# Patient Record
Sex: Female | Born: 1980 | Race: White | Hispanic: No | Smoking: Never smoker
Health system: Southern US, Community
[De-identification: ages and names within clinical notes are randomized; demographics above are authoritative.]

## PROBLEM LIST (undated history)

## (undated) DIAGNOSIS — R102 Pelvic and perineal pain: Secondary | ICD-10-CM

## (undated) DIAGNOSIS — K5900 Constipation, unspecified: Secondary | ICD-10-CM

## (undated) DIAGNOSIS — N801 Endometriosis of ovary: Secondary | ICD-10-CM

## (undated) DIAGNOSIS — Z973 Presence of spectacles and contact lenses: Secondary | ICD-10-CM

## (undated) DIAGNOSIS — N83299 Other ovarian cyst, unspecified side: Secondary | ICD-10-CM

## (undated) DIAGNOSIS — R011 Cardiac murmur, unspecified: Secondary | ICD-10-CM

## (undated) HISTORY — PX: DILATION AND CURETTAGE OF UTERUS: SHX78

---

## 2005-03-14 ENCOUNTER — Emergency Department (HOSPITAL_COMMUNITY): Admission: EM | Admit: 2005-03-14 | Discharge: 2005-03-14 | Payer: Self-pay | Admitting: Family Medicine

## 2005-03-27 ENCOUNTER — Emergency Department (HOSPITAL_COMMUNITY): Admission: EM | Admit: 2005-03-27 | Discharge: 2005-03-27 | Payer: Self-pay | Admitting: Family Medicine

## 2005-04-28 ENCOUNTER — Emergency Department (HOSPITAL_COMMUNITY): Admission: EM | Admit: 2005-04-28 | Discharge: 2005-04-28 | Payer: Self-pay | Admitting: Emergency Medicine

## 2005-10-07 ENCOUNTER — Emergency Department (HOSPITAL_COMMUNITY): Admission: EM | Admit: 2005-10-07 | Discharge: 2005-10-07 | Payer: Self-pay | Admitting: Family Medicine

## 2007-01-22 ENCOUNTER — Emergency Department (HOSPITAL_COMMUNITY): Admission: EM | Admit: 2007-01-22 | Discharge: 2007-01-22 | Payer: Self-pay | Admitting: Emergency Medicine

## 2007-09-27 ENCOUNTER — Emergency Department (HOSPITAL_COMMUNITY): Admission: EM | Admit: 2007-09-27 | Discharge: 2007-09-27 | Payer: Self-pay | Admitting: Family Medicine

## 2008-04-15 ENCOUNTER — Emergency Department (HOSPITAL_COMMUNITY): Admission: EM | Admit: 2008-04-15 | Discharge: 2008-04-15 | Payer: Self-pay | Admitting: Emergency Medicine

## 2008-06-24 ENCOUNTER — Emergency Department (HOSPITAL_COMMUNITY): Admission: EM | Admit: 2008-06-24 | Discharge: 2008-06-24 | Payer: Self-pay | Admitting: Family Medicine

## 2008-08-11 ENCOUNTER — Emergency Department (HOSPITAL_COMMUNITY): Admission: EM | Admit: 2008-08-11 | Discharge: 2008-08-11 | Payer: Self-pay | Admitting: Emergency Medicine

## 2008-09-05 ENCOUNTER — Emergency Department (HOSPITAL_COMMUNITY): Admission: EM | Admit: 2008-09-05 | Discharge: 2008-09-05 | Payer: Self-pay | Admitting: Emergency Medicine

## 2009-01-08 ENCOUNTER — Emergency Department (HOSPITAL_COMMUNITY): Admission: EM | Admit: 2009-01-08 | Discharge: 2009-01-08 | Payer: Self-pay | Admitting: Emergency Medicine

## 2009-01-08 ENCOUNTER — Ambulatory Visit: Payer: Self-pay | Admitting: Obstetrics and Gynecology

## 2009-01-08 ENCOUNTER — Inpatient Hospital Stay (HOSPITAL_COMMUNITY): Admission: AD | Admit: 2009-01-08 | Discharge: 2009-01-08 | Payer: Self-pay | Admitting: Obstetrics and Gynecology

## 2009-09-02 ENCOUNTER — Inpatient Hospital Stay (HOSPITAL_COMMUNITY): Admission: AD | Admit: 2009-09-02 | Discharge: 2009-09-03 | Payer: Self-pay | Admitting: Obstetrics & Gynecology

## 2009-09-03 ENCOUNTER — Inpatient Hospital Stay (HOSPITAL_COMMUNITY): Admission: AD | Admit: 2009-09-03 | Discharge: 2009-09-05 | Payer: Self-pay | Admitting: Obstetrics and Gynecology

## 2010-04-23 LAB — CBC
MCH: 33.9 pg (ref 26.0–34.0)
MCH: 34.5 pg — ABNORMAL HIGH (ref 26.0–34.0)
MCV: 99 fL (ref 78.0–100.0)
Platelets: 156 10*3/uL (ref 150–400)
Platelets: 186 10*3/uL (ref 150–400)
Platelets: 189 10*3/uL (ref 150–400)
RBC: 3.16 MIL/uL — ABNORMAL LOW (ref 3.87–5.11)
RBC: 3.57 MIL/uL — ABNORMAL LOW (ref 3.87–5.11)
RDW: 12.8 % (ref 11.5–15.5)
RDW: 13.1 % (ref 11.5–15.5)
WBC: 15.8 10*3/uL — ABNORMAL HIGH (ref 4.0–10.5)
WBC: 16.3 10*3/uL — ABNORMAL HIGH (ref 4.0–10.5)

## 2010-04-23 LAB — COMPREHENSIVE METABOLIC PANEL
Albumin: 2.8 g/dL — ABNORMAL LOW (ref 3.5–5.2)
BUN: 7 mg/dL (ref 6–23)
CO2: 21 mEq/L (ref 19–32)
Chloride: 108 mEq/L (ref 96–112)
Creatinine, Ser: 0.65 mg/dL (ref 0.4–1.2)
GFR calc non Af Amer: >60 mL/min (ref >60)
Total Bilirubin: 0.8 mg/dL (ref 0.3–1.2)

## 2010-04-23 LAB — URINALYSIS, ROUTINE W REFLEX MICROSCOPIC
Nitrite: NEGATIVE
Protein, ur: NEGATIVE mg/dL
Urobilinogen, UA: 0.2 mg/dL (ref 0.0–1.0)

## 2010-04-23 LAB — URIC ACID: Uric Acid, Serum: 5.5 mg/dL (ref 2.4–7.0)

## 2010-05-10 LAB — WET PREP, GENITAL
Trich, Wet Prep: NONE SEEN
Yeast Wet Prep HPF POC: NONE SEEN

## 2010-05-10 LAB — GC/CHLAMYDIA PROBE AMP, GENITAL: GC Probe Amp, Genital: NEGATIVE

## 2010-05-10 LAB — ABO/RH: ABO/RH(D): A POS

## 2010-05-15 LAB — URINE CULTURE

## 2010-05-15 LAB — POCT URINALYSIS DIP (DEVICE)
Bilirubin Urine: NEGATIVE
Glucose, UA: 500 mg/dL — AB
Ketones, ur: NEGATIVE mg/dL
pH: 5 (ref 5.0–8.0)

## 2010-05-15 LAB — GLUCOSE, CAPILLARY: Glucose-Capillary: 164 mg/dL — ABNORMAL HIGH (ref 70–99)

## 2010-05-17 LAB — GC/CHLAMYDIA PROBE AMP, GENITAL
Chlamydia, DNA Probe: NEGATIVE
GC Probe Amp, Genital: NEGATIVE

## 2010-05-17 LAB — WET PREP, GENITAL
Trich, Wet Prep: NONE SEEN
Yeast Wet Prep HPF POC: NONE SEEN

## 2010-11-11 LAB — URINE CULTURE

## 2010-11-11 LAB — POCT URINALYSIS DIP (DEVICE)
Ketones, ur: NEGATIVE
Operator id: 235561
Protein, ur: NEGATIVE
Specific Gravity, Urine: 1.01
Urobilinogen, UA: 0.2
pH: 5

## 2012-11-20 LAB — OB RESULTS CONSOLE HEPATITIS B SURFACE ANTIGEN: Hepatitis B Surface Ag: NEGATIVE

## 2012-11-20 LAB — OB RESULTS CONSOLE ABO/RH: RH Type: POSITIVE

## 2012-11-20 LAB — OB RESULTS CONSOLE GC/CHLAMYDIA
Chlamydia: NEGATIVE
GC PROBE AMP, GENITAL: NEGATIVE

## 2012-11-20 LAB — OB RESULTS CONSOLE RPR: RPR: NONREACTIVE

## 2012-11-20 LAB — OB RESULTS CONSOLE RUBELLA ANTIBODY, IGM: RUBELLA: IMMUNE

## 2012-11-20 LAB — OB RESULTS CONSOLE HIV ANTIBODY (ROUTINE TESTING): HIV: NONREACTIVE

## 2012-11-20 LAB — OB RESULTS CONSOLE ANTIBODY SCREEN: ANTIBODY SCREEN: NEGATIVE

## 2013-02-06 NOTE — L&D Delivery Note (Signed)
Delivery Note At 9:35 AM a viable and healthy female was delivered via Vaginal, Spontaneous Delivery (Presentation: Left Occiput Anterior).  APGAR: 9 and 9; weight pending Placenta status: Intact, Spontaneous.  Cord: 3 vessels with the following complications: .  Cord pH: N/A  Anesthesia: Epidural  Episiotomy: none Lacerations: 2nd degree Suture Repair: 3.0 vicryl rapide Est. Blood Loss (mL): 400cc  Mom to postpartum.  Baby to Couplet care / Skin to Skin.  Robley FriesVaishali R Zaylia Riolo 06/21/2013, 10:03 AM

## 2013-06-09 LAB — OB RESULTS CONSOLE GBS: GBS: NEGATIVE

## 2013-06-10 ENCOUNTER — Other Ambulatory Visit: Payer: Self-pay | Admitting: Obstetrics & Gynecology

## 2013-06-16 ENCOUNTER — Telehealth (HOSPITAL_COMMUNITY): Payer: Self-pay | Admitting: *Deleted

## 2013-06-16 ENCOUNTER — Encounter (HOSPITAL_COMMUNITY): Payer: Self-pay | Admitting: *Deleted

## 2013-06-16 NOTE — Telephone Encounter (Signed)
Preadmission screen  

## 2013-06-20 ENCOUNTER — Inpatient Hospital Stay (HOSPITAL_COMMUNITY)
Admission: RE | Admit: 2013-06-20 | Discharge: 2013-06-22 | DRG: 775 | Disposition: A | Discharge status: Home / Self Care | Payer: PRIVATE HEALTH INSURANCE | Source: Ambulatory Visit | Attending: Obstetrics & Gynecology | Admitting: Obstetrics & Gynecology

## 2013-06-20 ENCOUNTER — Encounter (HOSPITAL_COMMUNITY): Payer: Self-pay

## 2013-06-20 DIAGNOSIS — K219 Gastro-esophageal reflux disease without esophagitis: Secondary | ICD-10-CM | POA: Diagnosis present

## 2013-06-20 DIAGNOSIS — Z8249 Family history of ischemic heart disease and other diseases of the circulatory system: Secondary | ICD-10-CM

## 2013-06-20 DIAGNOSIS — IMO0001 Reserved for inherently not codable concepts without codable children: Secondary | ICD-10-CM

## 2013-06-20 DIAGNOSIS — Z833 Family history of diabetes mellitus: Secondary | ICD-10-CM

## 2013-06-20 LAB — CBC
HEMATOCRIT: 38.3 % (ref 36.0–46.0)
Hemoglobin: 13 g/dL (ref 12.0–15.0)
MCH: 32.2 pg (ref 26.0–34.0)
MCHC: 33.9 g/dL (ref 30.0–36.0)
MCV: 94.8 fL (ref 78.0–100.0)
PLATELETS: 220 10*3/uL (ref 150–400)
RBC: 4.04 MIL/uL (ref 3.87–5.11)
RDW: 13.8 % (ref 11.5–15.5)
WBC: 9.4 10*3/uL (ref 4.0–10.5)

## 2013-06-20 LAB — RPR

## 2013-06-20 MED ORDER — TERBUTALINE SULFATE 1 MG/ML IJ SOLN: 0.2500 mg | Freq: Once | INTRAMUSCULAR | Status: AC | PRN

## 2013-06-20 MED ORDER — LIDOCAINE HCL (PF) 1 % IJ SOLN
30.0000 mL | INTRAMUSCULAR | Status: DC | PRN
Start: 2013-06-20 — End: 2013-06-21
  Filled 2013-06-20: qty 30

## 2013-06-20 MED ORDER — IBUPROFEN 600 MG PO TABS: 600.0000 mg | ORAL_TABLET | Freq: Four times a day (QID) | ORAL | Status: DC | PRN

## 2013-06-20 MED ORDER — ACETAMINOPHEN 325 MG PO TABS: 650.0000 mg | ORAL_TABLET | ORAL | Status: DC | PRN

## 2013-06-20 MED ORDER — CITRIC ACID-SODIUM CITRATE 334-500 MG/5ML PO SOLN: 30.0000 mL | ORAL | Status: DC | PRN

## 2013-06-20 MED ORDER — LACTATED RINGERS IV SOLN
INTRAVENOUS | Status: DC
  Administered 2013-06-20: 14:00:00 via INTRAVENOUS

## 2013-06-20 MED ORDER — OXYTOCIN 40 UNITS IN LACTATED RINGERS INFUSION - SIMPLE MED
1.0000 m[IU]/min | INTRAVENOUS | Status: DC
  Administered 2013-06-20: 2 m[IU]/min via INTRAVENOUS
  Filled 2013-06-20: qty 1000

## 2013-06-20 MED ORDER — LACTATED RINGERS IV SOLN: 500.0000 mL | INTRAVENOUS | Status: DC | PRN

## 2013-06-20 MED ORDER — BUTORPHANOL TARTRATE 1 MG/ML IJ SOLN
1.0000 mg | INTRAMUSCULAR | Status: DC | PRN
  Administered 2013-06-21: 1 mg via INTRAVENOUS
  Filled 2013-06-20: qty 1

## 2013-06-20 MED ORDER — ONDANSETRON HCL 4 MG/2ML IJ SOLN: 4.0000 mg | Freq: Four times a day (QID) | INTRAMUSCULAR | Status: DC | PRN

## 2013-06-20 MED ORDER — OXYTOCIN BOLUS FROM INFUSION
500.0000 mL | INTRAVENOUS | Status: DC
  Administered 2013-06-21: 500 mL via INTRAVENOUS

## 2013-06-20 MED ORDER — OXYTOCIN 40 UNITS IN LACTATED RINGERS INFUSION - SIMPLE MED: 62.5000 mL/h | INTRAVENOUS | Status: DC

## 2013-06-20 MED ORDER — OXYCODONE-ACETAMINOPHEN 5-325 MG PO TABS: 1.0000 | ORAL_TABLET | ORAL | Status: DC | PRN

## 2013-06-20 NOTE — H&P (Signed)
Regina Scott is a 33 y.o. female presenting for labor IOL due to childcare needs at 39.1 wks. No UCs/ bleeding/ leaking.  PNCare with [facility]// primary Dr Juliene PinaMody. 1st trimester Prometrium and bbASA use for prior SAB and low progesterone in 1st trimester. Right hip pain with numbness in pregnancy. 45 lb wt gain with normal 3hr GTT.   History OB History   Grav Para Term Preterm Abortions TAB SAB Ect Mult Living   4 1 1  2 1 1   1      Past Medical History  Diagnosis Date  . GERD (gastroesophageal reflux disease)   . Other elevated white blood cell count   . Medical history non-contributory    Past Surgical History  Procedure Laterality Date  . Dilation and curettage of uterus     Family History: family history includes COPD in her father; Cancer in her mother; Diabetes in her maternal grandmother; Heart attack in her maternal grandmother. Social History:  reports that she has never smoked. She does not have any smokeless tobacco history on file. She reports that she does not drink alcohol or use illicit drugs.   Prenatal Transfer Tool  Maternal Diabetes: No Genetic Screening: Declined, AFP1 negative Maternal Ultrasounds/Referrals: Normal Fetal Ultrasounds or other Referrals:  None Maternal Substance Abuse:  No Significant Maternal Medications:  None Significant Maternal Lab Results:  Lab values include: Group B Strep negative Other Comments:  None  ROS neg   Dilation: 1.5 Effacement (%): 50 Station: -3 Exam by:: dr. Annamay Laymon Blood pressure 130/81, pulse 75, temperature 98.2 F (36.8 C), temperature source Oral, resp. rate 18, height 5\' 8"  (1.727 m), weight 207 lb (93.895 kg), last menstrual period 09/08/2012. Exam Physical Exam   A&O x 3, no acute distress. Pleasant HEENT neg, no thyromegaly Lungs CTA bilat CV RRR, S1S2 normal Abdo soft, non tender, non acute Extr no edema/ tenderness FHT 140s/ + accels/ no decels/ moderate variability- category I Toco  irregular  Prenatal labs: ABO, Rh: A/Positive/-- (10/15 0000) Antibody: Negative (10/15 0000) Rubella: Immune (10/15 0000) RPR: NON REAC (05/15 1340)  HBsAg: Negative (10/15 0000)  HIV: Non-reactive (10/15 0000)  GBS: Negative (05/04 0000)  1hr Gflucola 154, 3hr GTT normal   Assessment/Plan: 33 yo, G4P1021 at 39.1 wks, labor IOL for childcare arrangement of 1st baby.  GBS neg. Pitocin per protocol. FHT- category I. EFW 7.1/2- 8 lbs.   Robley FriesVaishali R Maude Gloor 06/20/2013

## 2013-06-21 ENCOUNTER — Encounter (HOSPITAL_COMMUNITY): Payer: Self-pay

## 2013-06-21 ENCOUNTER — Inpatient Hospital Stay (HOSPITAL_COMMUNITY): Payer: PRIVATE HEALTH INSURANCE | Admitting: Anesthesiology

## 2013-06-21 ENCOUNTER — Encounter (HOSPITAL_COMMUNITY): Payer: PRIVATE HEALTH INSURANCE | Admitting: Anesthesiology

## 2013-06-21 MED ORDER — FENTANYL 2.5 MCG/ML BUPIVACAINE 1/10 % EPIDURAL INFUSION (WH - ANES)
INTRAMUSCULAR | Status: DC | PRN
  Administered 2013-06-21: 14 mL/h via EPIDURAL

## 2013-06-21 MED ORDER — LORATADINE 10 MG PO TABS
10.0000 mg | ORAL_TABLET | Freq: Every day | ORAL | Status: DC
  Administered 2013-06-21 – 2013-06-22 (×2): 10 mg via ORAL
  Filled 2013-06-21 (×3): qty 1

## 2013-06-21 MED ORDER — PHENYLEPHRINE 40 MCG/ML (10ML) SYRINGE FOR IV PUSH (FOR BLOOD PRESSURE SUPPORT)
80.0000 ug | PREFILLED_SYRINGE | INTRAVENOUS | Status: DC | PRN
  Filled 2013-06-21: qty 2

## 2013-06-21 MED ORDER — WITCH HAZEL-GLYCERIN EX PADS: 1.0000 "application " | MEDICATED_PAD | CUTANEOUS | Status: DC | PRN

## 2013-06-21 MED ORDER — TETANUS-DIPHTH-ACELL PERTUSSIS 5-2.5-18.5 LF-MCG/0.5 IM SUSP
0.5000 mL | Freq: Once | INTRAMUSCULAR | Status: DC
Start: 2013-06-22 — End: 2013-06-22

## 2013-06-21 MED ORDER — PANTOPRAZOLE SODIUM 40 MG PO TBEC
40.0000 mg | DELAYED_RELEASE_TABLET | Freq: Every day | ORAL | Status: DC
  Administered 2013-06-22: 40 mg via ORAL
  Filled 2013-06-21 (×3): qty 1

## 2013-06-21 MED ORDER — OXYCODONE-ACETAMINOPHEN 5-325 MG PO TABS
1.0000 | ORAL_TABLET | ORAL | Status: DC | PRN
Start: 2013-06-21 — End: 2013-06-22
  Administered 2013-06-21 (×2): 1 via ORAL
  Filled 2013-06-21 (×2): qty 1

## 2013-06-21 MED ORDER — ONDANSETRON HCL 4 MG PO TABS: 4.0000 mg | ORAL_TABLET | ORAL | Status: DC | PRN

## 2013-06-21 MED ORDER — ONDANSETRON HCL 4 MG/2ML IJ SOLN: 4.0000 mg | INTRAMUSCULAR | Status: DC | PRN

## 2013-06-21 MED ORDER — FENTANYL 2.5 MCG/ML BUPIVACAINE 1/10 % EPIDURAL INFUSION (WH - ANES)
14.0000 mL/h | INTRAMUSCULAR | Status: DC | PRN
  Filled 2013-06-21: qty 125

## 2013-06-21 MED ORDER — SENNOSIDES-DOCUSATE SODIUM 8.6-50 MG PO TABS
2.0000 | ORAL_TABLET | ORAL | Status: DC
  Administered 2013-06-21: 2 via ORAL
  Filled 2013-06-21: qty 2

## 2013-06-21 MED ORDER — ZOLPIDEM TARTRATE 5 MG PO TABS: 5.0000 mg | ORAL_TABLET | Freq: Every evening | ORAL | Status: DC | PRN

## 2013-06-21 MED ORDER — BENZOCAINE-MENTHOL 20-0.5 % EX AERO
1.0000 "application " | INHALATION_SPRAY | CUTANEOUS | Status: DC | PRN
  Administered 2013-06-21: 1 via TOPICAL
  Filled 2013-06-21: qty 56

## 2013-06-21 MED ORDER — DIPHENHYDRAMINE HCL 50 MG/ML IJ SOLN: 12.5000 mg | INTRAMUSCULAR | Status: DC | PRN

## 2013-06-21 MED ORDER — EPHEDRINE 5 MG/ML INJ
10.0000 mg | INTRAVENOUS | Status: DC | PRN
  Filled 2013-06-21: qty 4
  Filled 2013-06-21: qty 2

## 2013-06-21 MED ORDER — EPHEDRINE 5 MG/ML INJ
10.0000 mg | INTRAVENOUS | Status: DC | PRN
  Filled 2013-06-21: qty 2

## 2013-06-21 MED ORDER — IBUPROFEN 600 MG PO TABS
600.0000 mg | ORAL_TABLET | Freq: Four times a day (QID) | ORAL | Status: DC
  Administered 2013-06-21 – 2013-06-22 (×5): 600 mg via ORAL
  Filled 2013-06-21 (×5): qty 1

## 2013-06-21 MED ORDER — LACTATED RINGERS IV SOLN: 500.0000 mL | Freq: Once | INTRAVENOUS | Status: DC

## 2013-06-21 MED ORDER — LANOLIN HYDROUS EX OINT: TOPICAL_OINTMENT | CUTANEOUS | Status: DC | PRN

## 2013-06-21 MED ORDER — DIPHENHYDRAMINE HCL 25 MG PO CAPS: 25.0000 mg | ORAL_CAPSULE | Freq: Four times a day (QID) | ORAL | Status: DC | PRN

## 2013-06-21 MED ORDER — DIBUCAINE 1 % RE OINT: 1.0000 "application " | TOPICAL_OINTMENT | RECTAL | Status: DC | PRN

## 2013-06-21 MED ORDER — SIMETHICONE 80 MG PO CHEW: 80.0000 mg | CHEWABLE_TABLET | ORAL | Status: DC | PRN

## 2013-06-21 MED ORDER — PHENYLEPHRINE 40 MCG/ML (10ML) SYRINGE FOR IV PUSH (FOR BLOOD PRESSURE SUPPORT)
80.0000 ug | PREFILLED_SYRINGE | INTRAVENOUS | Status: DC | PRN
  Filled 2013-06-21: qty 2
  Filled 2013-06-21: qty 10

## 2013-06-21 MED ORDER — LIDOCAINE HCL (PF) 1 % IJ SOLN
INTRAMUSCULAR | Status: DC | PRN
  Administered 2013-06-21 (×2): 4 mL

## 2013-06-21 MED ORDER — PRENATAL MULTIVITAMIN CH
1.0000 | ORAL_TABLET | Freq: Every day | ORAL | Status: DC
  Administered 2013-06-21 – 2013-06-22 (×2): 1 via ORAL
  Filled 2013-06-21 (×2): qty 1

## 2013-06-21 NOTE — Lactation Note (Signed)
This note was copied from the chart of [name]. Lactation Consultation Note Follow [facility] at 14 hours of age[facility] pain/trauma.  Comfort gels given by Rn.  Mom has normal erect nipples with bruising/blistering and now misshaped nipple on right breast.  Middle area is whitish with little colostrum expressed and rubbed into nipples.  Comfort gels not in use and I assisted with putting them on her.  She was wearing sore nipple shells with visible edema to areola.  Encouraged to not use shells and to keep comfort gels on through out the night.  Mom has a hand pump to use on left breast and allow baby to feed on demand on right.   Baby sucks well and coordinated on gloved finger.  Tongue is heart shaped on the tip with a questionable tight frenulum, but does not seem to be an issue with baby's suck.  [facility]    Patient Name: [name] WUJWJ'XToday's Date: 06/21/2013 Reason for consult: Follow-up assessment;Difficult latch;Breast/nipple pain   Maternal Data Has patient been taught Hand Expression?: Yes  Feeding Feeding Type: Breast Fed Length of feed: 5 min  LATCH Score/Interventions Latch: Repeated attempts needed to sustain latch, nipple held in mouth throughout feeding, stimulation needed to elicit sucking reflex. Intervention(s): Skin to skin Intervention(s): Adjust position;Assist with latch;Breast massage;Breast compression  Audible Swallowing: A few with stimulation Intervention(s): Skin to skin  Type of Nipple: Everted at rest and after stimulation  Comfort (Breast/Nipple): Engorged, cracked, bleeding, large blisters, severe discomfort Problem noted: Cracked, bleeding, blisters, bruises Intervention(s): Other (comment) (comfort gels)     Hold (Positioning): No assistance needed to correctly position infant at breast. Intervention(s): Breastfeeding basics reviewed  LATCH Score: 6  Lactation Tools Discussed/Used Tools:  Comfort gels   Consult Status Consult Status: Follow-up Date: 06/22/13 Follow-up type: In-patient    [name] 06/21/2013, 11:50 PM

## 2013-06-21 NOTE — Anesthesia Procedure Notes (Signed)
Epidural Patient location during procedure: OB Start time: 06/21/2013 6:22 AM  Staffing Anesthesiologist: Haedyn Ancrum A. Performed by: anesthesiologist   Preanesthetic Checklist Completed: patient identified, site marked, surgical consent, pre-op evaluation, timeout performed, IV checked, risks and benefits discussed and monitors and equipment checked  Epidural Patient position: sitting Prep: site prepped and draped and DuraPrep Patient monitoring: continuous pulse ox and blood pressure Approach: midline Location: L3-L4 Injection technique: LOR air  Needle:  Needle type: Tuohy  Needle gauge: 17 G Needle length: 9 cm and 9 Needle insertion depth: 5 cm cm Catheter type: closed end flexible Catheter size: 19 Gauge Catheter at skin depth: 10 cm Test dose: negative and Other  Assessment Events: blood not aspirated, injection not painful, no injection resistance, negative IV test and no paresthesia  Additional Notes Patient identified. Risks and benefits discussed including failed block, incomplete  Pain control, post dural puncture headache, nerve damage, paralysis, blood pressure Changes, nausea, vomiting, reactions to medications-both toxic and allergic and post Partum back pain. All questions were answered. Patient expressed understanding and wished to proceed. Sterile technique was used throughout procedure. Epidural site was Dressed with sterile barrier dressing. No paresthesias, signs of intravascular injection Or signs of intrathecal spread were encountered.  Patient was more comfortable after the epidural was dosed. Please see RN's note for documentation of vital signs and FHR which are stable.

## 2013-06-21 NOTE — Anesthesia Preprocedure Evaluation (Signed)
Anesthesia Evaluation  Patient identified by MRN, date of birth, ID band Patient awake    Reviewed: Allergy & Precautions, H&P , Patient's Chart, lab work & pertinent test results  Airway Mallampati: III TM Distance: >3 FB Neck ROM: Full    Dental no notable dental hx. (+) Teeth Intact   Pulmonary neg pulmonary ROS,  breath sounds clear to auscultation  Pulmonary exam normal       Cardiovascular negative cardio ROS  Rhythm:Regular Rate:Normal     Neuro/Psych negative neurological ROS  negative psych ROS   GI/Hepatic Neg liver ROS, GERD-  Medicated and Controlled,  Endo/Other  Obesity   Renal/GU negative Renal ROS  negative genitourinary   Musculoskeletal negative musculoskeletal ROS (+)   Abdominal (+) + obese,   Peds  Hematology negative hematology ROS (+)   Anesthesia Other Findings   Reproductive/Obstetrics (+) Pregnancy                           Anesthesia Physical Anesthesia Plan  ASA: II  Anesthesia Plan: Epidural   Post-op Pain Management:    Induction:   Airway Management Planned: Natural Airway  Additional Equipment:   Intra-op Plan:   Post-operative Plan:   Informed Consent: I have reviewed the patients History and Physical, chart, labs and discussed the procedure including the risks, benefits and alternatives for the proposed anesthesia with the patient or authorized representative who has indicated his/her understanding and acceptance.     Plan Discussed with: Anesthesiologist  Anesthesia Plan Comments:         Anesthesia Quick Evaluation

## 2013-06-22 LAB — CBC
HCT: 36.5 % (ref 36.0–46.0)
Hemoglobin: 12.1 g/dL (ref 12.0–15.0)
MCH: 31.6 pg (ref 26.0–34.0)
MCHC: 33.2 g/dL (ref 30.0–36.0)
MCV: 95.3 fL (ref 78.0–100.0)
PLATELETS: 220 10*3/uL (ref 150–400)
RBC: 3.83 MIL/uL — ABNORMAL LOW (ref 3.87–5.11)
RDW: 14 % (ref 11.5–15.5)
WBC: 11.6 10*3/uL — ABNORMAL HIGH (ref 4.0–10.5)

## 2013-06-22 MED ORDER — IBUPROFEN 600 MG PO TABS: 600.0000 mg | ORAL_TABLET | Freq: Four times a day (QID) | ORAL | Status: DC

## 2013-06-22 MED ORDER — OXYCODONE-ACETAMINOPHEN 5-325 MG PO TABS: 1.0000 | ORAL_TABLET | ORAL | Status: DC | PRN

## 2013-06-22 NOTE — Lactation Note (Signed)
This note was copied from the chart of Regina Scott. Lactation Consultation Note Left nipple cracked and mom pumping and wearing comfort gels in between.  Assisted with positioning baby on right breast using football hold.  Baby latched easily with good breast compression.  Mom feels initial latch on pain which improves when bottom lip pulled out.  Reviewed basics and answered questions.  Discharge instructions given including what to expect when milk comes to volume and engorgement treatment. Outpatient lactation services and support information given. Patient Name: Regina Scott ZOXWR'UToday's Date: 06/22/2013 Reason for consult: Initial assessment   Maternal Data    Feeding Feeding Type: Breast Fed  LATCH Score/Interventions Latch: Grasps breast easily, tongue down, lips flanged, rhythmical sucking. Intervention(s): Skin to skin;Teach feeding cues;Waking techniques Intervention(s): Adjust position;Assist with latch;Breast massage;Breast compression  Audible Swallowing: A few with stimulation  Type of Nipple: Everted at rest and after stimulation  Comfort (Breast/Nipple): Filling, red/small blisters or bruises, mild/mod discomfort Problem noted: Cracked, bleeding, blisters, bruises Intervention(s): Hand pump     Hold (Positioning): Assistance needed to correctly position infant at breast and maintain latch. Intervention(s): Breastfeeding basics reviewed;Support Pillows;Position options;Skin to skin  LATCH Score: 7  Lactation Tools Discussed/Used Tools: Comfort gels   Consult Status Consult Status: Follow-up Date: 06/23/13 Follow-up type: In-patient    Hansel FeinsteinLaura Ann Powell 06/22/2013, 12:48 PM

## 2013-06-22 NOTE — Discharge Summary (Signed)
Obstetric Discharge Summary  Reason for Admission: induction of labor Prenatal Procedures: none Intrapartum Procedures: spontaneous vaginal delivery Postpartum Procedures: none Complications-Operative and Postpartum: 2nd degree perineal laceration Hemoglobin  Date Value Ref Range Status  06/22/2013 12.1  12.0 - 15.0 g/dL Final     HCT  Date Value Ref Range Status  06/22/2013 36.5  36.0 - 46.0 % Final    Physical Exam:  General: alert, cooperative and no distress Lochia: appropriate Uterine Fundus: firm Incision: healing well DVT Evaluation: No evidence of DVT seen on physical exam.  Discharge Diagnoses: Term Pregnancy-delivered  Discharge Information: Date: 06/22/2013 Activity: pelvic rest Diet: routine Medications: PNV, Ibuprofen and Percocet Condition: stable Instructions: refer to practice specific booklet Discharge to: home Follow-up Information   Follow up with MODY,VAISHALI R, MD. Schedule an appointment as soon as possible for a visit in 6 weeks.   Specialty:  Obstetrics and Gynecology   Contact information:   [address] Gash[address] [place] [id number] [phone]       Newborn Data: Live born female  Birth Weight: 8 lb (3629 g) APGAR: 9, 9  Home with mother.  Marlinda Mikeanya Bryar Rennie 06/22/2013, 1:37 PM

## 2013-06-22 NOTE — Progress Notes (Signed)
PPD 1 SVD  S:  Reports feeling well - wants early DC             Tolerating po/ No nausea or vomiting             Bleeding is light             Pain controlled with motrin and percocet             Up ad lib / ambulatory / voiding QS  Newborn breast feeding  O:               VS: BP 100/64  Pulse 71  Temp(Src) 98 F (36.7 C) (Oral)  Resp 18  Ht 5\' 8"  (1.727 m)  Wt 93.895 kg (207 lb)  BMI 31.48 kg/m2  SpO2 98%  LMP 09/08/2012  Breastfeeding? Unknown   LABS:              Recent Labs  06/20/13 1340 06/22/13 0601  WBC 9.4 11.6*  HGB 13.0 12.1  PLT 220 220               Blood type: A/Positive/-- (10/15 0000)  Rubella: Immune (10/15 0000)                     I&O: Intake/Output     05/16 0701 - 05/17 0700 05/17 0701 - 05/18 0700   Urine (mL/kg/hr) 500 (0.2)    Blood 400 (0.2)    Total Output 900     Net -900                        Physical Exam:             Alert and oriented X3  Lungs: Clear and unlabored  Heart: regular rate and rhythm / no mumurs  Abdomen: soft, non-tender, non-distended              Fundus: firm, non-tender, U-1  Perineum: moderate edema  Lochia: light  Extremities: trace edema, no calf pain or tenderness    A: PPD # 1   Doing well - stable status  P: Routine post partum orders  DC home   Marlinda Mikeanya Taylee Gunnells CNM, MSN, [facility] 06/22/2013, 1:19 PM

## 2013-06-22 NOTE — Anesthesia Postprocedure Evaluation (Signed)
  Anesthesia Post-op Note  Patient: [name]  Procedure(s) Performed: Lumbar Epidural for L&D  Patient Location: Mother/Baby  Anesthesia Type:Epidural  Level of Consciousness: awake, alert  and oriented  Airway and Oxygen Therapy: Patient Spontanous Breathing  Last Vitals:  Filed Vitals:   06/22/13 0510  BP: 100/64  Pulse: 71  Temp: 36.7 C  Resp: 18    Complications: No apparent anesthesia complications

## 2013-06-25 ENCOUNTER — Inpatient Hospital Stay (HOSPITAL_COMMUNITY)
Admission: AD | Admit: 2013-06-25 | Payer: PRIVATE HEALTH INSURANCE | Source: Ambulatory Visit | Admitting: Obstetrics & Gynecology

## 2013-07-01 NOTE — Discharge Summary (Signed)
Reviewed and agree with note and plan. V.Ozelle Brubacher, MD  

## 2013-12-08 ENCOUNTER — Encounter (HOSPITAL_COMMUNITY): Payer: Self-pay

## 2017-02-06 ENCOUNTER — Inpatient Hospital Stay (HOSPITAL_COMMUNITY): Payer: PRIVATE HEALTH INSURANCE

## 2017-02-06 ENCOUNTER — Other Ambulatory Visit: Payer: Self-pay

## 2017-02-06 ENCOUNTER — Encounter (HOSPITAL_COMMUNITY): Payer: Self-pay

## 2017-02-06 ENCOUNTER — Inpatient Hospital Stay (HOSPITAL_COMMUNITY)
Admission: AD | Admit: 2017-02-06 | Discharge: 2017-02-07 | Disposition: A | Discharge status: Home / Self Care | Payer: PRIVATE HEALTH INSURANCE | Source: Ambulatory Visit | Attending: Obstetrics and Gynecology | Admitting: Obstetrics and Gynecology

## 2017-02-06 DIAGNOSIS — N946 Dysmenorrhea, unspecified: Secondary | ICD-10-CM | POA: Diagnosis not present

## 2017-02-06 DIAGNOSIS — Z880 Allergy status to penicillin: Secondary | ICD-10-CM | POA: Diagnosis not present

## 2017-02-06 DIAGNOSIS — N83292 Other ovarian cyst, left side: Secondary | ICD-10-CM | POA: Insufficient documentation

## 2017-02-06 DIAGNOSIS — N939 Abnormal uterine and vaginal bleeding, unspecified: Secondary | ICD-10-CM | POA: Insufficient documentation

## 2017-02-06 DIAGNOSIS — R102 Pelvic and perineal pain: Secondary | ICD-10-CM | POA: Diagnosis not present

## 2017-02-06 DIAGNOSIS — Z825 Family history of asthma and other chronic lower respiratory diseases: Secondary | ICD-10-CM | POA: Diagnosis not present

## 2017-02-06 DIAGNOSIS — R1030 Lower abdominal pain, unspecified: Secondary | ICD-10-CM | POA: Diagnosis present

## 2017-02-06 DIAGNOSIS — N83202 Unspecified ovarian cyst, left side: Secondary | ICD-10-CM

## 2017-02-06 LAB — URINALYSIS, ROUTINE W REFLEX MICROSCOPIC
Bilirubin Urine: NEGATIVE
Glucose, UA: NEGATIVE mg/dL
Ketones, ur: NEGATIVE mg/dL
Leukocytes, UA: NEGATIVE
Nitrite: NEGATIVE
Protein, ur: NEGATIVE mg/dL
Specific Gravity, Urine: 1.018 (ref 1.005–1.030)
pH: 5 (ref 5.0–8.0)

## 2017-02-06 LAB — DIFFERENTIAL
Basophils Absolute: 0 10*3/uL (ref 0.0–0.1)
Basophils Relative: 0 %
EOS ABS: 0.1 10*3/uL (ref 0.0–0.7)
EOS PCT: 1 %
LYMPHS ABS: 2.8 10*3/uL (ref 0.7–4.0)
LYMPHS PCT: 17 %
Monocytes Absolute: 0.6 10*3/uL (ref 0.1–1.0)
Monocytes Relative: 4 %
NEUTROS PCT: 78 %
Neutro Abs: 12.7 10*3/uL — ABNORMAL HIGH (ref 1.7–7.7)

## 2017-02-06 LAB — CBC
HCT: 38.2 % (ref 36.0–46.0)
Hemoglobin: 12.8 g/dL (ref 12.0–15.0)
MCH: 31.3 pg (ref 26.0–34.0)
MCHC: 33.5 g/dL (ref 30.0–36.0)
MCV: 93.4 fL (ref 78.0–100.0)
Platelets: 300 10*3/uL (ref 150–400)
RBC: 4.09 MIL/uL (ref 3.87–5.11)
RDW: 13 % (ref 11.5–15.5)
WBC: 16.4 10*3/uL — ABNORMAL HIGH (ref 4.0–10.5)

## 2017-02-06 LAB — WET PREP, GENITAL
Clue Cells Wet Prep HPF POC: NONE SEEN
Sperm: NONE SEEN
TRICH WET PREP: NONE SEEN
Yeast Wet Prep HPF POC: NONE SEEN

## 2017-02-06 LAB — POCT PREGNANCY, URINE: PREG TEST UR: NEGATIVE

## 2017-02-06 MED ORDER — TRAMADOL HCL 50 MG PO TABS
50.0000 mg | ORAL_TABLET | Freq: Four times a day (QID) | ORAL | Status: DC | PRN
  Administered 2017-02-06: 50 mg via ORAL
  Filled 2017-02-06: qty 1

## 2017-02-06 NOTE — MAU Provider Note (Signed)
History     CSN: [id number]  Arrival date and time: 02/06/17 2114   First Provider Initiated Contact with Patient 02/06/17 2236      Chief Complaint  Patient presents with  . Abdominal Pain  . Vaginal Bleeding   37 y.o non-pregnant female here with pelvic and abdominal pain. Pain started earlier today. Mostly bilateral lower abdomen but radiates into upper abd. Rates 7/10. Took Motrin 800 mg and used heat pad but didn't help. Concerned about vaginal bleeding. Menses started yesterday. Passing small dime sized clots. Changing pads 3 times a day. Not currently sexually active. Recent STD screen at office negative. She has a left ovarian 3cm hemorrhagic cyst that is being followed by GYN.   Past Medical History:  Diagnosis Date  . GERD (gastroesophageal reflux disease)    during pregnancy in 2015  . Other elevated white blood cell count     Past Surgical History:  Procedure Laterality Date  . DILATION AND CURETTAGE OF UTERUS      Family History  Problem Relation Age of Onset  . Cancer Mother        breast  . COPD Father   . Diabetes Maternal Grandmother   . Heart attack Maternal Grandmother     Social History   Tobacco Use  . Smoking status: Never Smoker  . Smokeless tobacco: Never Used  Substance Use Topics  . Alcohol use: No  . Drug use: No    Allergies:  Allergies  Allergen Reactions  . Penicillins Rash    No medications prior to admission.    Review of Systems  Constitutional: Negative for chills and fever.  HENT: Negative for congestion.   Respiratory: Negative for cough.   Gastrointestinal: Positive for abdominal pain. Negative for constipation, diarrhea, nausea and vomiting.  Genitourinary: Positive for pelvic pain and vaginal bleeding. Negative for dysuria, frequency, hematuria and vaginal discharge.   Physical Exam   Blood pressure 106/71, pulse 75, temperature 97.9 F (36.6 C), temperature source Oral, resp. rate 18, height 5\' 8"  (1.727 m),  weight 161 lb (73 kg), last menstrual period 01/19/2017, SpO2 100 %, unknown if currently breastfeeding.  Physical Exam  Nursing note and vitals reviewed. Constitutional: She is oriented to person, place, and time. She appears well-developed and well-nourished. Distressed: appears comfortable.  HENT:  Head: Normocephalic and atraumatic.  Neck: Normal range of motion.  Cardiovascular: Normal rate.  Respiratory: Effort normal. No respiratory distress.  GI: Soft. She exhibits no distension and no mass. There is tenderness (diffuse all quadrants). There is no rebound and no guarding.  Genitourinary:  Genitourinary Comments: External: no lesions or erythema Vagina: rugated, pink, moist, mod drk bloody discharge, vault cleared with 2 fox swabs Uterus: non enlarged, anteverted, + tender, no CMT Adnexae: no masses, + tenderness left, + tenderness right   Musculoskeletal: Normal range of motion.  Neurological: She is alert and oriented to person, place, and time.  Skin: Skin is warm and dry.  Psychiatric: She has a normal mood and affect.   Results for orders placed or performed during the hospital encounter of 02/06/17 (from the past 24 hour(s))  Pregnancy, urine POC     Status: None   Collection Time: 02/06/17 10:13 PM  Result Value Ref Range   Preg Test, Ur NEGATIVE NEGATIVE  Urinalysis, Routine w reflex microscopic     Status: Abnormal   Collection Time: 02/06/17 10:14 PM  Result Value Ref Range   Color, Urine YELLOW YELLOW   APPearance CLEAR CLEAR  Specific Gravity, Urine 1.018 1.005 - 1.030   pH 5.0 5.0 - 8.0   Glucose, UA NEGATIVE NEGATIVE mg/dL   Hgb urine dipstick LARGE (A) NEGATIVE   Bilirubin Urine NEGATIVE NEGATIVE   Ketones, ur NEGATIVE NEGATIVE mg/dL   Protein, ur NEGATIVE NEGATIVE mg/dL   Nitrite NEGATIVE NEGATIVE   Leukocytes, UA NEGATIVE NEGATIVE   RBC / HPF TOO NUMEROUS TO COUNT 0 - 5 RBC/hpf   WBC, UA 0-5 0 - 5 WBC/hpf   Bacteria, UA RARE (A) NONE SEEN    Squamous Epithelial / LPF 0-5 (A) NONE SEEN   Mucus PRESENT   Wet prep, genital     Status: Abnormal   Collection Time: 02/06/17 10:37 PM  Result Value Ref Range   Yeast Wet Prep HPF POC NONE SEEN NONE SEEN   Trich, Wet Prep NONE SEEN NONE SEEN   Clue Cells Wet Prep HPF POC NONE SEEN NONE SEEN   WBC, Wet Prep HPF POC FEW (A) NONE SEEN   Sperm NONE SEEN   CBC     Status: Abnormal   Collection Time: 02/06/17 10:38 PM  Result Value Ref Range   WBC 16.4 (H) 4.0 - 10.5 K/uL   RBC 4.09 3.87 - 5.11 MIL/uL   Hemoglobin 12.8 12.0 - 15.0 g/dL   HCT 44.0 10.2 - 72.5 %   MCV 93.4 78.0 - 100.0 fL   MCH 31.3 26.0 - 34.0 pg   MCHC 33.5 30.0 - 36.0 g/dL   RDW 36.6 44.0 - 34.7 %   Platelets 300 150 - 400 K/uL  Differential     Status: Abnormal   Collection Time: 02/06/17 10:38 PM  Result Value Ref Range   Neutrophils Relative % 78 %   Neutro Abs 12.7 (H) 1.7 - 7.7 K/uL   Lymphocytes Relative 17 %   Lymphs Abs 2.8 0.7 - 4.0 K/uL   Monocytes Relative 4 %   Monocytes Absolute 0.6 0.1 - 1.0 K/uL   Eosinophils Relative 1 %   Eosinophils Absolute 0.1 0.0 - 0.7 K/uL   Basophils Relative 0 %   Basophils Absolute 0.0 0.0 - 0.1 K/uL   US Pelvic Complete W Transvaginal And Torsion R/o  Result Date: 02/07/2017 CLINICAL DATA:  Initial evaluation for acute pain, vaginal bleeding. History of left ovarian cyst. EXAM: TRANSABDOMINAL AND TRANSVAGINAL ULTRASOUND OF PELVIS DOPPLER ULTRASOUND OF OVARIES TECHNIQUE: Both transabdominal and transvaginal ultrasound examinations of the pelvis were performed. Transabdominal technique was performed for global imaging of the pelvis including uterus, ovaries, adnexal regions, and pelvic cul-de-sac. It was necessary to proceed with endovaginal exam following the transabdominal exam to visualize the uterus and ovaries. Color and duplex Doppler ultrasound was utilized to evaluate blood flow to the ovaries. COMPARISON:  None. FINDINGS: Uterus Measurements: 8.7 x 3.7 x 5.2 cm. No  fibroids or other mass visualized. Endometrium Thickness: 4.2 mm.  No focal abnormality visualized. Right ovary Measurements: 3.3 x 2.4 x 2.4 cm. Normal appearance/no adnexal mass. Left ovary Measurements: 6.5 x 5.0 x 6.0 cm. 5.8 x 4.0 x 5.1 cm complex left ovarian cyst. Lesion demonstrates eccentric echogenic material with probable fluid fluid level. No internal vascularity or discrete nodularity. Mildly increased peripheral vascularity about this lesion. Pulsed Doppler evaluation of both ovaries demonstrates normal low-resistance arterial and venous waveforms. Other findings No abnormal free fluid. IMPRESSION: 1. 5.8 x 4.0 x 5.1 cm complex left ovarian cyst. Imaging features favor a hemorrhagic cyst with internal clot and/or fluid fluid level. Given size, a short  interval follow-up study in 6-12 weeks to ensure resolution is recommended. This recommendation follows the consensus statement: Management of Asymptomatic Ovarian and Other Adnexal Cysts Imaged at US: Society of Radiologists in Ultrasound Consensus Conference Statement. Radiology 2010; [id number]. 2. No other acute abnormality within the pelvis. No evidence for torsion. Electronically Signed   By: Rise MuBenjamin  McClintock M.D.   On: 02/07/2017 00:14   MAU Course  Procedures Ultram Toradol  MDM Labs and US ordered and reviewed. Slightly elevated WBC- no source identified. Presentation, clinical findings, and plan discussed with Dr. Ellyn HackBovard. No evidence of acute abdominal or pelvic process. Cyst slightly larger than previous scan, could be source of pain or pain coming from recent onset menses. Pt resting quietly. Some improvement in pain after Toradol. Recommend Aleve 2 tabs q12 hrs prn. Stable for discharge.   Assessment and Plan   1. Left ovarian cyst   2. Pelvic pain   3. Dysmenorrhea    [facility] Aleve OTC prn  Allergies as of 02/07/2017      Reactions   Penicillins Rash      Medication List     STOP taking these medications   ibuprofen 600 MG tablet Commonly known as:  ADVIL,MOTRIN   oxyCODONE-acetaminophen 5-325 MG tablet Commonly known as:  PERCOCET/ROXICET     TAKE these medications   cetirizine 10 MG tablet Commonly known as:  ZYRTEC Take 10 mg by mouth daily.   pantoprazole 40 MG tablet Commonly known as:  PROTONIX Take 40 mg by mouth daily.   prenatal multivitamin Tabs tablet Take 1 tablet by mouth daily at 12 noon.      Donette LarryMelanie Atari Novick, CNM 02/07/2017, 4:24 AM

## 2017-02-06 NOTE — Progress Notes (Addendum)
Non-pregnant pt here for dime size vaginal bleeding that started yesterday. Pt has a golf size cyst on her left ovaries. MD has been monitoring the cyst. U/S scheduled for Feb.   UPT: negative  2233: Dr. Ellyn HackBovard called to unit. Report status of pt given. No new orders received but will discuss POC with NP.   2240: wet prep done. Ordered for U/S   2246: lab at bs  2306: u/s paged and U/S returned page. Made aware pt needs u/s.   2312: relinquished care over to RN Benji.

## 2017-02-06 NOTE — MAU Note (Signed)
Pt here with c/o heavy vaginal bleeding and abdominal pain. Had a cyst ("golf ball sized in left ovary") when seen in the office in early December. At that point had been bleeding for a couple of weeks. Bleeding stopped for about two weeks then started back yesterday, then the pain today.

## 2017-02-07 DIAGNOSIS — R102 Pelvic and perineal pain: Secondary | ICD-10-CM

## 2017-02-07 DIAGNOSIS — N83202 Unspecified ovarian cyst, left side: Secondary | ICD-10-CM | POA: Diagnosis not present

## 2017-02-07 DIAGNOSIS — N946 Dysmenorrhea, unspecified: Secondary | ICD-10-CM | POA: Diagnosis not present

## 2017-02-07 MED ORDER — KETOROLAC TROMETHAMINE 30 MG/ML IJ SOLN
60.0000 mg | Freq: Once | INTRAMUSCULAR | Status: AC
  Administered 2017-02-07: 60 mg via INTRAMUSCULAR
  Filled 2017-02-07 (×2): qty 2

## 2017-02-07 NOTE — Discharge Instructions (Signed)
Ovarian Cyst An ovarian cyst is a fluid-filled sac that forms on an ovary. The ovaries are small organs that produce eggs in women. Various types of cysts can form on the ovaries. Some may cause symptoms and require treatment. Most ovarian cysts go away on their own, are not cancerous (are benign), and do not cause problems. Common types of ovarian cysts include:  Functional (follicle) cysts. ? Occur during the menstrual cycle, and usually go away with the next menstrual cycle if you do not get pregnant. ? Usually cause no symptoms.  Endometriomas. ? Are cysts that form from the tissue that lines the uterus (endometrium). ? Are sometimes called "chocolate cysts" because they become filled with blood that turns brown. ? Can cause pain in the lower abdomen during intercourse and during your period.  Cystadenoma cysts. ? Develop from cells on the outside surface of the ovary. ? Can get very large and cause lower abdomen pain and pain with intercourse. ? Can cause severe pain if they twist or break open (rupture).  Dermoid cysts. ? Are sometimes found in both ovaries. ? May contain different kinds of body tissue, such as skin, teeth, hair, or cartilage. ? Usually do not cause symptoms unless they get very big.  Theca lutein cysts. ? Occur when too much of a certain hormone (human chorionic gonadotropin) is produced and overstimulates the ovaries to produce an egg. ? Are most common after having procedures used to assist with the conception of a baby (in vitro fertilization).  What are the causes? Ovarian cysts may be caused by:  Ovarian hyperstimulation syndrome. This is a condition that can develop from taking fertility medicines. It causes multiple large ovarian cysts to form.  Polycystic ovarian syndrome (PCOS). This is a common hormonal disorder that can cause ovarian cysts, as well as problems with your period or fertility.  What increases the risk? The following factors may make  you more likely to develop ovarian cysts:  Being overweight or obese.  Taking fertility medicines.  Taking certain forms of hormonal birth control.  Smoking.  What are the signs or symptoms? Many ovarian cysts do not cause symptoms. If symptoms are present, they may include:  Pelvic pain or pressure.  Pain in the lower abdomen.  Pain during sex.  Abdominal swelling.  Abnormal menstrual periods.  Increasing pain with menstrual periods.  How is this diagnosed? These cysts are commonly found during a routine pelvic exam. You may have tests to find out more about the cyst, such as:  Ultrasound.  X-ray of the pelvis.  CT scan.  MRI.  Blood tests.  How is this treated? Many ovarian cysts go away on their own without treatment. Your health care provider may want to check your cyst regularly for 2-3 months to see if it changes. If you are in menopause, it is especially important to have your cyst monitored closely because menopausal women have a higher rate of ovarian cancer. When treatment is needed, it may include:  Medicines to help relieve pain.  A procedure to drain the cyst (aspiration).  Surgery to remove the whole cyst.  Hormone treatment or birth control pills. These methods are sometimes used to help dissolve a cyst.  Follow these instructions at home:  Take over-the-counter and prescription medicines only as told by your health care provider.  Do not drive or use heavy machinery while taking prescription pain medicine.  Get regular pelvic exams and Pap tests as often as told by your health care   provider.  Return to your normal activities as told by your health care provider. Ask your health care provider what activities are safe for you.  Do not use any products that contain nicotine or tobacco, such as cigarettes and e-cigarettes. If you need help quitting, ask your health care provider.  Keep all follow-up visits as told by your health care provider.  This is important. Contact a health care provider if:  Your periods are late, irregular, or painful, or they stop.  You have pelvic pain that does not go away.  You have pressure on your bladder or trouble emptying your bladder completely.  You have pain during sex.  You have any of the following in your abdomen: ? A feeling of fullness. ? Pressure. ? Discomfort. ? Pain that does not go away. ? Swelling.  You feel generally ill.  You become constipated.  You lose your appetite.  You develop severe acne.  You start to have more body hair and facial hair.  You are gaining weight or losing weight without changing your exercise and eating habits.  You think you may be pregnant. Get help right away if:  You have abdominal pain that is severe or gets worse.  You cannot eat or drink without vomiting.  You suddenly develop a fever.  Your menstrual period is much heavier than usual. This information is not intended to replace advice given to you by your health care provider. Make sure you discuss any questions you have with your health care provider. Document Released: 01/23/2005 Document Revised: 08/13/2015 Document Reviewed: 06/27/2015 Elsevier Interactive Patient Education  2018 Elsevier Inc.  

## 2017-02-09 ENCOUNTER — Encounter (HOSPITAL_COMMUNITY)
Admission: RE | Admit: 2017-02-09 | Discharge: 2017-02-09 | Disposition: A | Discharge status: Home / Self Care | Payer: PRIVATE HEALTH INSURANCE | Source: Ambulatory Visit | Attending: Obstetrics and Gynecology | Admitting: Obstetrics and Gynecology

## 2017-02-09 ENCOUNTER — Other Ambulatory Visit: Payer: Self-pay

## 2017-02-09 ENCOUNTER — Encounter (HOSPITAL_BASED_OUTPATIENT_CLINIC_OR_DEPARTMENT_OTHER): Payer: Self-pay | Admitting: Obstetrics and Gynecology

## 2017-02-09 DIAGNOSIS — Z01812 Encounter for preprocedural laboratory examination: Secondary | ICD-10-CM | POA: Insufficient documentation

## 2017-02-09 DIAGNOSIS — N83299 Other ovarian cyst, unspecified side: Secondary | ICD-10-CM

## 2017-02-09 DIAGNOSIS — R102 Pelvic and perineal pain unspecified side: Secondary | ICD-10-CM

## 2017-02-09 HISTORY — DX: Pelvic and perineal pain unspecified side: R10.20

## 2017-02-09 HISTORY — DX: Pelvic and perineal pain: R10.2

## 2017-02-09 HISTORY — DX: Other ovarian cyst, unspecified side: N83.299

## 2017-02-09 LAB — COMPREHENSIVE METABOLIC PANEL
ALBUMIN: 4.3 g/dL (ref 3.5–5.0)
ALT: 11 U/L — ABNORMAL LOW (ref 14–54)
AST: 13 U/L — ABNORMAL LOW (ref 15–41)
Alkaline Phosphatase: 57 U/L (ref 38–126)
Anion gap: 7 (ref 5–15)
BUN: 12 mg/dL (ref 6–20)
CALCIUM: 9.4 mg/dL (ref 8.9–10.3)
CHLORIDE: 104 mmol/L (ref 101–111)
CO2: 26 mmol/L (ref 22–32)
Creatinine, Ser: 0.63 mg/dL (ref 0.44–1.00)
GFR calc non Af Amer: >60 mL/min (ref >60)
GLUCOSE: 96 mg/dL (ref 65–99)
Potassium: 3.8 mmol/L (ref 3.5–5.1)
SODIUM: 137 mmol/L (ref 135–145)
Total Bilirubin: 1.5 mg/dL — ABNORMAL HIGH (ref 0.3–1.2)
Total Protein: 7.8 g/dL (ref 6.5–8.1)

## 2017-02-09 LAB — CBC
HEMATOCRIT: 37.8 % (ref 36.0–46.0)
HEMOGLOBIN: 12.8 g/dL (ref 12.0–15.0)
MCH: 31.7 pg (ref 26.0–34.0)
MCHC: 33.9 g/dL (ref 30.0–36.0)
MCV: 93.6 fL (ref 78.0–100.0)
Platelets: 375 10*3/uL (ref 150–400)
RBC: 4.04 MIL/uL (ref 3.87–5.11)
RDW: 12.9 % (ref 11.5–15.5)
WBC: 9 10*3/uL (ref 4.0–10.5)

## 2017-02-09 NOTE — Progress Notes (Signed)
SPOKE W/ PT VIA PHONE FOR PRE-OP INTERVIEW.  NPO AFTER MN.  ARRIVE AT 0600.  NEEDS URINE PREG.  GETTING CBC, CMET, T&S DONE TODAY .  WILL TAKE AM MED DOS W/ SIPS OF WATER.

## 2017-02-10 LAB — ABO/RH: ABO/RH(D): A POS

## 2017-02-12 NOTE — Anesthesia Preprocedure Evaluation (Addendum)
Anesthesia Evaluation  Patient identified by MRN, date of birth, ID band Patient awake    Reviewed: Allergy & Precautions, NPO status , Patient's Chart, lab work & pertinent test results  Airway Mallampati: II  TM Distance: >3 FB Neck ROM: Full    Dental  (+) Dental Advisory Given   Pulmonary neg pulmonary ROS,    breath sounds clear to auscultation       Cardiovascular negative cardio ROS   Rhythm:Regular Rate:Normal     Neuro/Psych negative neurological ROS     GI/Hepatic negative GI ROS, Neg liver ROS,   Endo/Other  negative endocrine ROS  Renal/GU negative Renal ROS     Musculoskeletal   Abdominal   Peds  Hematology negative hematology ROS (+)   Anesthesia Other Findings   Reproductive/Obstetrics                            Lab Results  Component Value Date   WBC 9.0 02/09/2017   HGB 12.8 02/09/2017   HCT 37.8 02/09/2017   MCV 93.6 02/09/2017   PLT 375 02/09/2017   Lab Results  Component Value Date   CREATININE 0.63 02/09/2017   BUN 12 02/09/2017   NA 137 02/09/2017   K 3.8 02/09/2017   CL 104 02/09/2017   CO2 26 02/09/2017    Anesthesia Physical Anesthesia Plan  ASA: I  Anesthesia Plan: General   Post-op Pain Management:    Induction: Intravenous  PONV Risk Score and Plan: 4 or greater and Scopolamine patch - Pre-op, Midazolam, Dexamethasone, Ondansetron and Treatment may vary due to age or medical condition  Airway Management Planned: Oral ETT  Additional Equipment:   Intra-op Plan:   Post-operative Plan: Extubation in OR  Informed Consent: I have reviewed the patients History and Physical, chart, labs and discussed the procedure including the risks, benefits and alternatives for the proposed anesthesia with the patient or authorized representative who has indicated his/her understanding and acceptance.   Dental advisory given  Plan Discussed with:  CRNA  Anesthesia Plan Comments:        Anesthesia Quick Evaluation

## 2017-02-12 NOTE — H&P (Signed)
Regina Scott is an 37 y.o. female [phone] with enlarging, painful L ovarian cyst - hemorrhagic vs endometrioma.  Recently diagnosed with 4cm L ovarian cyst.  Enlarged on US 1 month after diagnosis up to 5cm.  No torsion noted.  Pt with increased pain.  Also Nausea and vomiting.  Normal CA-125 when drawn.  D/W pt r/b/a of surgery - will proceed with LSO, laparoscopic.    Pertinent Gynecological History: No abnormal pap - last 5/27, HR HPV neg No STDs  OCP for contraception [id number] TAB, SAB SVD x 2,at 40wk, female and female  Menstrual History:  No LMP recorded (lmp unknown).    Past Medical History:  Diagnosis Date  . Constipation   . Heart murmur    per pt was told during pregnancy's was told doctor hears at murmur , no work-up done and asymptomatic  . Ovarian cyst, complex 02/09/2017  . Pelvic pain 02/09/2017  . Wears glasses     Past Surgical History:  Procedure Laterality Date  . DILATION AND CURETTAGE OF UTERUS  2013  approx.   w/  suction for missed ab  also TAB  Family History  Problem Relation Age of Onset  . Cancer Mother        breast  . COPD Father   . Diabetes Maternal Grandmother   . Heart attack Maternal Grandmother     Social History:  reports that  has never smoked. she has never used smokeless tobacco. She reports that she does not drink alcohol or use drugs. 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 Beef-Derived Products Nausea And Vomiting    "EXTREMELY SICK"  . Penicillins Rash   Meds: rizatriptan, sertraline, topiramate   Review of Systems  Constitutional: Positive for malaise/fatigue.  HENT: Negative.   Eyes: Negative.   Respiratory: Negative.   Cardiovascular: Negative.   Gastrointestinal: Positive for abdominal pain, nausea and vomiting.  Genitourinary: Negative.   Musculoskeletal: Negative.   Skin: Negative.   Neurological: Negative.   Psychiatric/Behavioral: Negative.     unknown if currently  breastfeeding. Physical Exam  Constitutional: She is oriented to person, place, and time. She appears well-developed and well-nourished.  HENT:  Head: Normocephalic and atraumatic.  Cardiovascular: Normal rate and regular rhythm.  Respiratory: Effort normal and breath sounds normal. No respiratory distress. She has no wheezes.  GI: Soft. Bowel sounds are normal. She exhibits no distension. There is tenderness.  Musculoskeletal: Normal range of motion.  Neurological: She is alert and oriented to person, place, and time.  Skin: Skin is warm and dry.  Psychiatric: She has a normal mood and affect. Her behavior is normal.   US - nl uterus, R ov with simple cyst; L ovary with complex ov cyst (hemorrhagic cyst vs endometrioma)  Enlarging ovarian cyst, no torsion  Assessment/Plan: 21JY [id number] [id number] with complex left ovarian cyst. L/S LSO - d/w pt r/b/a - will proceed  Jourdin Connors Bovard-Stuckert 02/12/2017, 9:00 PM

## 2017-02-13 ENCOUNTER — Other Ambulatory Visit: Payer: Self-pay

## 2017-02-13 ENCOUNTER — Encounter (HOSPITAL_BASED_OUTPATIENT_CLINIC_OR_DEPARTMENT_OTHER): Payer: Self-pay | Admitting: *Deleted

## 2017-02-13 ENCOUNTER — Ambulatory Visit (HOSPITAL_BASED_OUTPATIENT_CLINIC_OR_DEPARTMENT_OTHER): Payer: PRIVATE HEALTH INSURANCE | Admitting: Anesthesiology

## 2017-02-13 ENCOUNTER — Encounter (HOSPITAL_BASED_OUTPATIENT_CLINIC_OR_DEPARTMENT_OTHER)
Admission: RE | Disposition: A | Discharge status: Home / Self Care | Payer: Self-pay | Source: Ambulatory Visit | Attending: Obstetrics and Gynecology

## 2017-02-13 ENCOUNTER — Ambulatory Visit (HOSPITAL_BASED_OUTPATIENT_CLINIC_OR_DEPARTMENT_OTHER)
Admission: RE | Admit: 2017-02-13 | Discharge: 2017-02-13 | Disposition: A | Discharge status: Home / Self Care | Payer: PRIVATE HEALTH INSURANCE | Source: Ambulatory Visit | Attending: Obstetrics and Gynecology | Admitting: Obstetrics and Gynecology

## 2017-02-13 DIAGNOSIS — R102 Pelvic and perineal pain unspecified side: Secondary | ICD-10-CM | POA: Diagnosis present

## 2017-02-13 DIAGNOSIS — N80109 Endometriosis of ovary, unspecified side, unspecified depth: Secondary | ICD-10-CM

## 2017-02-13 DIAGNOSIS — N83202 Unspecified ovarian cyst, left side: Secondary | ICD-10-CM | POA: Diagnosis not present

## 2017-02-13 DIAGNOSIS — N736 Female pelvic peritoneal adhesions (postinfective): Secondary | ICD-10-CM | POA: Diagnosis not present

## 2017-02-13 DIAGNOSIS — R011 Cardiac murmur, unspecified: Secondary | ICD-10-CM | POA: Diagnosis not present

## 2017-02-13 DIAGNOSIS — N83299 Other ovarian cyst, unspecified side: Secondary | ICD-10-CM | POA: Diagnosis present

## 2017-02-13 DIAGNOSIS — Z880 Allergy status to penicillin: Secondary | ICD-10-CM | POA: Insufficient documentation

## 2017-02-13 DIAGNOSIS — N801 Endometriosis of ovary: Secondary | ICD-10-CM

## 2017-02-13 DIAGNOSIS — Z91018 Allergy to other foods: Secondary | ICD-10-CM | POA: Insufficient documentation

## 2017-02-13 DIAGNOSIS — Z8249 Family history of ischemic heart disease and other diseases of the circulatory system: Secondary | ICD-10-CM | POA: Insufficient documentation

## 2017-02-13 DIAGNOSIS — N802 Endometriosis of fallopian tube: Secondary | ICD-10-CM | POA: Diagnosis not present

## 2017-02-13 HISTORY — DX: Cardiac murmur, unspecified: R01.1

## 2017-02-13 HISTORY — PX: LAPAROSCOPIC SALPINGO OOPHERECTOMY: SHX5927

## 2017-02-13 HISTORY — DX: Presence of spectacles and contact lenses: Z97.3

## 2017-02-13 HISTORY — DX: Endometriosis of ovary: N80.1

## 2017-02-13 HISTORY — DX: Endometriosis of ovary, unspecified side, unspecified depth: N80.109

## 2017-02-13 HISTORY — DX: Other ovarian cyst, unspecified side: N83.299

## 2017-02-13 HISTORY — DX: Constipation, unspecified: K59.00

## 2017-02-13 HISTORY — DX: Pelvic and perineal pain: R10.2

## 2017-02-13 LAB — TYPE AND SCREEN
ABO/RH(D): A POS
Antibody Screen: NEGATIVE

## 2017-02-13 SURGERY — SALPINGO-OOPHORECTOMY, LAPAROSCOPIC
Anesthesia: General | Laterality: Left

## 2017-02-13 MED ORDER — MIDAZOLAM HCL 2 MG/2ML IJ SOLN
INTRAMUSCULAR | Status: DC | PRN
  Administered 2017-02-13: 2 mg via INTRAVENOUS

## 2017-02-13 MED ORDER — FENTANYL CITRATE (PF) 100 MCG/2ML IJ SOLN
INTRAMUSCULAR | Status: DC | PRN
  Administered 2017-02-13: 75 ug via INTRAVENOUS
  Administered 2017-02-13 (×2): 25 ug via INTRAVENOUS

## 2017-02-13 MED ORDER — LACTATED RINGERS IV SOLN
INTRAVENOUS | Status: DC
  Administered 2017-02-13 (×2): via INTRAVENOUS
  Filled 2017-02-13: qty 1000

## 2017-02-13 MED ORDER — SUGAMMADEX SODIUM 200 MG/2ML IV SOLN
INTRAVENOUS | Status: DC | PRN
  Administered 2017-02-13: 150 mg via INTRAVENOUS

## 2017-02-13 MED ORDER — IBUPROFEN 800 MG PO TABS: 800.0000 mg | ORAL_TABLET | Freq: Three times a day (TID) | ORAL | 1 refills | Status: DC | PRN

## 2017-02-13 MED ORDER — PROPOFOL 10 MG/ML IV BOLUS
INTRAVENOUS | Status: DC | PRN
  Administered 2017-02-13: 160 mg via INTRAVENOUS

## 2017-02-13 MED ORDER — KETOROLAC TROMETHAMINE 30 MG/ML IJ SOLN
INTRAMUSCULAR | Status: DC | PRN
  Administered 2017-02-13: 30 mg via INTRAVENOUS

## 2017-02-13 MED ORDER — LACTATED RINGERS IV SOLN
INTRAVENOUS | Status: DC
  Filled 2017-02-13: qty 1000

## 2017-02-13 MED ORDER — OXYCODONE-ACETAMINOPHEN 5-325 MG PO TABS
1.0000 | ORAL_TABLET | Freq: Once | ORAL | Status: AC
  Administered 2017-02-13: 1 via ORAL
  Filled 2017-02-13: qty 1

## 2017-02-13 MED ORDER — PROPOFOL 10 MG/ML IV BOLUS
INTRAVENOUS | Status: AC
  Filled 2017-02-13: qty 20

## 2017-02-13 MED ORDER — ALBUTEROL SULFATE (2.5 MG/3ML) 0.083% IN NEBU
2.5000 mg | INHALATION_SOLUTION | Freq: Four times a day (QID) | RESPIRATORY_TRACT | Status: DC | PRN
  Administered 2017-02-13: 2.5 mg via RESPIRATORY_TRACT
  Filled 2017-02-13: qty 3

## 2017-02-13 MED ORDER — PROMETHAZINE HCL 25 MG/ML IJ SOLN
6.2500 mg | INTRAMUSCULAR | Status: DC | PRN
  Administered 2017-02-13: 6.25 mg via INTRAVENOUS
  Filled 2017-02-13: qty 1

## 2017-02-13 MED ORDER — DEXAMETHASONE SODIUM PHOSPHATE 10 MG/ML IJ SOLN
INTRAMUSCULAR | Status: AC
  Filled 2017-02-13: qty 1

## 2017-02-13 MED ORDER — KETOROLAC TROMETHAMINE 30 MG/ML IJ SOLN
INTRAMUSCULAR | Status: AC
  Filled 2017-02-13: qty 1

## 2017-02-13 MED ORDER — ROCURONIUM BROMIDE 10 MG/ML (PF) SYRINGE
PREFILLED_SYRINGE | INTRAVENOUS | Status: DC | PRN
  Administered 2017-02-13: 50 mg via INTRAVENOUS

## 2017-02-13 MED ORDER — EPHEDRINE 5 MG/ML INJ
INTRAVENOUS | Status: AC
  Filled 2017-02-13: qty 10

## 2017-02-13 MED ORDER — METOCLOPRAMIDE HCL 5 MG/ML IJ SOLN
10.0000 mg | Freq: Once | INTRAMUSCULAR | Status: AC
  Administered 2017-02-13: 10 mg via INTRAVENOUS
  Filled 2017-02-13: qty 2

## 2017-02-13 MED ORDER — ROCURONIUM BROMIDE 50 MG/5ML IV SOSY
PREFILLED_SYRINGE | INTRAVENOUS | Status: AC
  Filled 2017-02-13: qty 5

## 2017-02-13 MED ORDER — BUPIVACAINE HCL (PF) 0.25 % IJ SOLN
INTRAMUSCULAR | Status: DC | PRN
  Administered 2017-02-13: 20 mL

## 2017-02-13 MED ORDER — MIDAZOLAM HCL 2 MG/2ML IJ SOLN
INTRAMUSCULAR | Status: AC
  Filled 2017-02-13: qty 2

## 2017-02-13 MED ORDER — HYDROMORPHONE HCL 1 MG/ML IJ SOLN
INTRAMUSCULAR | Status: AC
  Filled 2017-02-13: qty 1

## 2017-02-13 MED ORDER — ONDANSETRON HCL 4 MG/2ML IJ SOLN
4.0000 mg | Freq: Once | INTRAMUSCULAR | Status: AC | PRN
  Administered 2017-02-13: 4 mg via INTRAVENOUS
  Filled 2017-02-13: qty 2

## 2017-02-13 MED ORDER — HYDROMORPHONE HCL 1 MG/ML IJ SOLN
0.2500 mg | INTRAMUSCULAR | Status: DC | PRN
  Administered 2017-02-13: 0.25 mg via INTRAVENOUS
  Filled 2017-02-13: qty 0.5

## 2017-02-13 MED ORDER — OXYCODONE-ACETAMINOPHEN 5-325 MG PO TABS: 1.0000 | ORAL_TABLET | Freq: Four times a day (QID) | ORAL | 0 refills | Status: DC | PRN

## 2017-02-13 MED ORDER — LIDOCAINE 2% (20 MG/ML) 5 ML SYRINGE
INTRAMUSCULAR | Status: DC | PRN
  Administered 2017-02-13: 60 mg via INTRAVENOUS

## 2017-02-13 MED ORDER — LIDOCAINE 2% (20 MG/ML) 5 ML SYRINGE
INTRAMUSCULAR | Status: AC
  Filled 2017-02-13: qty 5

## 2017-02-13 MED ORDER — ONDANSETRON HCL 4 MG/2ML IJ SOLN
INTRAMUSCULAR | Status: AC
  Filled 2017-02-13: qty 2

## 2017-02-13 MED ORDER — DEXAMETHASONE SODIUM PHOSPHATE 10 MG/ML IJ SOLN
INTRAMUSCULAR | Status: DC | PRN
  Administered 2017-02-13 (×2): 10 mg via INTRAVENOUS

## 2017-02-13 MED ORDER — FENTANYL CITRATE (PF) 100 MCG/2ML IJ SOLN
INTRAMUSCULAR | Status: AC
  Filled 2017-02-13: qty 2

## 2017-02-13 MED ORDER — SUGAMMADEX SODIUM 200 MG/2ML IV SOLN
INTRAVENOUS | Status: AC
  Filled 2017-02-13: qty 2

## 2017-02-13 MED ORDER — PROMETHAZINE HCL 25 MG/ML IJ SOLN
INTRAMUSCULAR | Status: AC
  Filled 2017-02-13: qty 1

## 2017-02-13 MED ORDER — KETOROLAC TROMETHAMINE 30 MG/ML IJ SOLN
30.0000 mg | Freq: Once | INTRAMUSCULAR | Status: DC | PRN
  Administered 2017-02-13: 30 mg via INTRAVENOUS
  Filled 2017-02-13: qty 1

## 2017-02-13 MED ORDER — ALBUTEROL SULFATE (2.5 MG/3ML) 0.083% IN NEBU
INHALATION_SOLUTION | RESPIRATORY_TRACT | Status: AC
  Filled 2017-02-13: qty 3

## 2017-02-13 MED ORDER — ONDANSETRON HCL 4 MG/2ML IJ SOLN
INTRAMUSCULAR | Status: DC | PRN
  Administered 2017-02-13: 4 mg via INTRAVENOUS

## 2017-02-13 MED ORDER — EPHEDRINE SULFATE-NACL 50-0.9 MG/10ML-% IV SOSY
PREFILLED_SYRINGE | INTRAVENOUS | Status: DC | PRN
  Administered 2017-02-13 (×2): 10 mg via INTRAVENOUS

## 2017-02-13 MED ORDER — HYDROMORPHONE HCL 1 MG/ML IJ SOLN
0.2500 mg | INTRAMUSCULAR | Status: DC | PRN
  Filled 2017-02-13: qty 0.5

## 2017-02-13 MED ORDER — OXYCODONE-ACETAMINOPHEN 5-325 MG PO TABS
ORAL_TABLET | ORAL | Status: AC
  Filled 2017-02-13: qty 1

## 2017-02-13 MED ORDER — METOCLOPRAMIDE HCL 5 MG/ML IJ SOLN
INTRAMUSCULAR | Status: AC
  Filled 2017-02-13: qty 2

## 2017-02-13 SURGICAL SUPPLY — 36 items
ADH SKN CLS APL DERMABOND .7 (GAUZE/BANDAGES/DRESSINGS) ×3
APPLICATOR COTTON TIP 6IN NSTR (MISCELLANEOUS) ×9 IMPLANT
BAG RETRIEVAL 10MM (BASKET) ×1
CABLE HIGH FREQUENCY MONO STRZ (ELECTRODE) IMPLANT
COVER MAYO STAND STRL (DRAPES) IMPLANT
DERMABOND ADVANCED (GAUZE/BANDAGES/DRESSINGS) ×6
DERMABOND ADVANCED .7 DNX12 (GAUZE/BANDAGES/DRESSINGS) ×1 IMPLANT
DRSG COVADERM PLUS 2X2 (GAUZE/BANDAGES/DRESSINGS) ×4 IMPLANT
DRSG OPSITE POSTOP 3X4 (GAUZE/BANDAGES/DRESSINGS) ×2 IMPLANT
DURAPREP 26ML APPLICATOR (WOUND CARE) ×3 IMPLANT
GLOVE BIO SURGEON STRL SZ 6.5 (GLOVE) ×2 IMPLANT
GLOVE BIO SURGEONS STRL SZ 6.5 (GLOVE) ×1
GLOVE BIOGEL PI IND STRL 7.0 (GLOVE) ×2 IMPLANT
GLOVE BIOGEL PI INDICATOR 7.0 (GLOVE) ×4
GOWN STRL REUS W/TWL LRG LVL3 (GOWN DISPOSABLE) ×6 IMPLANT
NEEDLE INSUFFLATION 120MM (ENDOMECHANICALS) ×3 IMPLANT
NS IRRIG 500ML POUR BTL (IV SOLUTION) ×3 IMPLANT
PACK LAPAROSCOPY BASIN (CUSTOM PROCEDURE TRAY) ×3 IMPLANT
PAD ION LEFT ARM DISP (MISCELLANEOUS) IMPLANT
PAD POSITIONING PINK XL (MISCELLANEOUS) IMPLANT
SET IRRIG TUBING LAPAROSCOPIC (IRRIGATION / IRRIGATOR) IMPLANT
SHEARS HARMONIC ACE PLUS 36CM (ENDOMECHANICALS) ×2 IMPLANT
SLEEVE XCEL OPT CAN 5 100 (ENDOMECHANICALS) ×3 IMPLANT
SUT VIC AB 2-0 SH 27 (SUTURE) ×3
SUT VIC AB 2-0 SH 27XBRD (SUTURE) ×1 IMPLANT
SUT VICRYL 0 UR6 27IN ABS (SUTURE) IMPLANT
SUT VICRYL 4-0 PS2 18IN ABS (SUTURE) ×6 IMPLANT
SYS BAG RETRIEVAL 10MM (BASKET) ×2
SYSTEM BAG RETRIEVAL 10MM (BASKET) IMPLANT
TOWEL OR 17X24 6PK STRL BLUE (TOWEL DISPOSABLE) ×6 IMPLANT
TRAY FOLEY BAG SILVER LF 14FR (CATHETERS) ×3 IMPLANT
TROCAR BALLN 12MMX100 BLUNT (TROCAR) ×2 IMPLANT
TROCAR XCEL NON-BLD 11X100MML (ENDOMECHANICALS) IMPLANT
TROCAR XCEL NON-BLD 5MMX100MML (ENDOMECHANICALS) ×3 IMPLANT
TUBING INSUF HEATED (TUBING) ×3 IMPLANT
WARMER LAPAROSCOPE (MISCELLANEOUS) ×3 IMPLANT

## 2017-02-13 NOTE — Transfer of Care (Signed)
Immediate Anesthesia Transfer of Care Note  Patient: Regina Scott  Procedure(s) Performed: Procedure(s) (LRB): operative laparoscopy, left salpingo oophorectomy, lysis of adhesions, fulgeration of endometriosis (Left)  Patient Location: PACU  Anesthesia Type: General  Level of Consciousness: awake, oriented, sedated and patient cooperative  Airway & Oxygen Therapy: Patient Spontanous Breathing and Patient connected to face mask oxygen  Post-op Assessment: Report given to PACU RN and Post -op Vital signs reviewed and stable  Post vital signs: Reviewed and stable  Complications: No apparent anesthesia complications Last Vitals:  Vitals:   02/13/17 0601 02/13/17 0953  BP: 123/70 (P) 126/67  Pulse: 66   Resp: 16 (P) 17  Temp: 36.4 C (P) 36.6 C  SpO2: 100%     Last Pain:  Vitals:   02/13/17 0656  TempSrc:   PainSc: 3       Patients Stated Pain Goal: 8 (02/13/17 0656)

## 2017-02-13 NOTE — Progress Notes (Signed)
Pt walked from stretcher to bathroom.  Voided and walked to recliner.  Tolerated well.  Pt states pain is 4-5/10 in lower abdomen, and states she is fine and that it is tolerable for her. Pt alert and oriented.

## 2017-02-13 NOTE — Anesthesia Postprocedure Evaluation (Signed)
Anesthesia Post Note  Patient: Leane ParaDonna Hammock  Procedure(s) Performed: operative laparoscopy, left salpingo oophorectomy, lysis of adhesions, fulgeration of endometriosis (Left )     Patient location during evaluation: PACU Anesthesia Type: General Level of consciousness: awake and alert Pain management: pain level controlled Vital Signs Assessment: post-procedure vital signs reviewed and stable Respiratory status: spontaneous breathing, nonlabored ventilation, respiratory function stable and patient connected to nasal cannula oxygen Cardiovascular status: blood pressure returned to baseline and stable Postop Assessment: no apparent nausea or vomiting Anesthetic complications: no    Last Vitals:  Vitals:   02/13/17 1115 02/13/17 1130  BP: 113/65 113/81  Pulse: 69 (!) 101  Resp: 15 (!) 22  Temp:    SpO2: 95% 97%    Last Pain:  Vitals:   02/13/17 1145  TempSrc:   PainSc: 6                  Kennieth RadFitzgerald, Jasmine Mcbeth E

## 2017-02-13 NOTE — Anesthesia Procedure Notes (Signed)
Procedure Name: Intubation Date/Time: 02/13/2017 7:55 AM Performed by: Suan Halter, CRNA Pre-anesthesia Checklist: Patient identified, Emergency Drugs available, Suction available and Patient being monitored Patient Re-evaluated:Patient Re-evaluated prior to induction Oxygen Delivery Method: Circle system utilized Preoxygenation: Pre-oxygenation with 100% oxygen Induction Type: IV induction Ventilation: Mask ventilation without difficulty Laryngoscope Size: Mac and 3 Grade View: Grade I Tube type: Oral Tube size: 7.0 mm Number of attempts: 1 Airway Equipment and Method: Stylet and Oral airway Placement Confirmation: ETT inserted through vocal cords under direct vision,  positive ETCO2 and breath sounds checked- equal and bilateral Secured at: 21 cm Tube secured with: Tape Dental Injury: Teeth and Oropharynx as per pre-operative assessment

## 2017-02-13 NOTE — Interval H&P Note (Signed)
History and Physical Interval Note:  02/13/2017 7:35 AM  Regina Scott  has presented today for surgery, with the diagnosis of cyst of ovary  The various methods of treatment have been discussed with the patient and family. After consideration of risks, benefits and other options for treatment, the patient has consented to  Procedure(s) with comments: LAPAROSCOPIC LEFT SALPINGO OOPHORECTOMY (Left) - MD request RNFA as a surgical intervention .  The patient's history has been reviewed, patient examined, no change in status, stable for surgery.  I have reviewed the patient's chart and labs.  Questions were answered to the patient's satisfaction.     Regina Scott

## 2017-02-13 NOTE — Progress Notes (Signed)
Following up from earlier call from PACU  Pt resting without complaints. She denies being in pain, difficulty breathing or any chest discomfort. Lungs CTA bilaterally. Heart RRR. O2 Sat WNL. Pt Stable.OK to D/C home PACU Nursing calling patients husband to bedside in preparation of immanent discharge to home.   Rikki SpearingS. Houser, M.D.

## 2017-02-13 NOTE — Brief Op Note (Signed)
02/13/2017  9:50 AM  PATIENT:  Regina Scott  37 y.o. female  PRE-OPERATIVE DIAGNOSIS:  cyst of ovary, pelvic pain  POST-OPERATIVE DIAGNOSIS:  cyst of ovary, endometrioma, endometriosis, adhesions  PROCEDURE:  Operative laparoscopy, LSO, LOA, fulguration of endometriosis  SURGEON:  Surgeon(s) and Role:    * Bovard-Stuckert, Mianna Iezzi, MD - Primary  ASSISTANTS: XXX, Holly RNFA   ANESTHESIA:   local and general  EBL:  20 mL   BLOOD ADMINISTERED:none  DRAINS: none   LOCAL MEDICATIONS USED:  MARCAINE     SPECIMEN:  Source of Specimen:  left tube and ovary  DISPOSITION OF SPECIMEN:  PATHOLOGY  COUNTS:  YES  TOURNIQUET:  * No tourniquets in log *  DICTATION: .Other Dictation: Dictation Number [id number]  PLAN OF CARE: Discharge to home after PACU  PATIENT DISPOSITION:  PACU - hemodynamically stable.   Delay start of Pharmacological VTE agent (>24hrs) due to surgical blood loss or risk of bleeding: not applicable

## 2017-02-13 NOTE — Discharge Instructions (Signed)
Post Anesthesia Home Care Instructions  Activity: Get plenty of rest for the remainder of the day. A responsible individual must stay with you for 24 hours following the procedure.  For the next 24 hours, DO NOT: -Drive a car -Advertising copywriter -Drink alcoholic beverages -Take any medication unless instructed by your physician -Make any legal decisions or sign important papers.  Meals: Start with liquid foods such as gelatin or soup. Progress to regular foods as tolerated. Avoid greasy, spicy, heavy foods. If nausea and/or vomiting occur, drink only clear liquids until the nausea and/or vomiting subsides. Call your physician if vomiting continues.  Special Instructions/Symptoms: Your throat may feel dry or sore from the anesthesia or the breathing tube placed in your throat during surgery. If this causes discomfort, gargle with warm salt water. The discomfort should disappear within 24 hours.  If you had a scopolamine patch placed behind your ear for the management of post- operative nausea and/or vomiting:  1. The medication in the patch is effective for 72 hours, after which it should be removed.  Wrap patch in a tissue and discard in the trash. Wash hands thoroughly with soap and water. 2. You may remove the patch earlier than 72 hours if you experience unpleasant side effects which may include dry mouth, dizziness or visual disturbances. 3. Avoid touching the patch. Wash your hands with soap and water after contact with the patch.    HOME CARE INSTRUCTIONS - LAPAROSCOPY  Wound Care: The bandaids or dressing which are placed over the skin openings may be removed the day after surgery. The incision should be kept clean and dry. The stitches do not need to be removed. Should the incision become sore, red, and swollen after the first week, check with your doctor.  Personal Hygiene: Shower the day after your procedure. Always wipe from front to back after elimination.   Activity: Do  not drive or operate any equipment today. The effects of the anesthesia are still present and drowsiness may result. Rest today, not necessarily flat bed rest, just take it easy. You may resume your normal activity in one to three days or as instructed by your physician.  Sexual Activity: You resume sexual activity as indicated by your physician_________. If your laparoscopy was for a sterilization ( tubes tied ), continue current method of birth control until after your next period or ask for specific instructions from your doctor.  Diet: Eat a light diet as desired this evening. You may resume a regular diet tomorrow.  Return to Work: Two to three days or as indicated by your doctor.  Expectations After Surgery: Your surgery will cause vaginal drainage or spotting which may continue for 2-3 days. Mild abdominal discomfort or tenderness is not unusual and some shoulder pain may also be noted which can be relieved by lying flat in pain.  Call Your Doctor If these Occur:  Persistent or heavy bleeding at incision site       Redness or swelling around incision       Elevation of temperature greater than 100 degrees F    Unilateral Salpingo-Oophorectomy, Care After Refer to this sheet in the next few weeks. These instructions provide you with information on caring for yourself after your procedure. Your health care provider may also give you more specific instructions. Your treatment has been planned according to current medical practices, but problems sometimes occur. Call your health care provider if you have any problems or questions after your procedure. What can I  expect after the procedure? After the procedure, it is typical to have the following:  Abdominal pain that can be controlled with pain medicine.  Vaginal spotting.  Constipation.  Follow these instructions at home:  Get plenty of rest and sleep.  Only take over-the-counter or prescription medicines as directed by your  health care provider. Do not take aspirin. It can cause bleeding.  Keep incision areas clean and dry. Remove or change any bandages (dressings) only as directed by your health care provider.  Follow your health care provider's advice regarding diet.  Drink enough fluids to keep your urine clear or pale yellow.  Limit exercise and activities as directed by your health care provider. Do not lift anything heavier than 5 pounds (2.3 kg) until your health care provider approves.  Do not drive until your health care provider approves.  Do not drink alcohol until your health care provider approves.  Do not have sexual intercourse until your health care provider says it is OK.  Take your temperature twice a day and write it down.  If you become constipated, you may: ? Ask your health care provider about taking a mild laxative. ? Add more fruit and bran to your diet. ? Drink more fluids.  Follow up with your health care provider as directed. Contact a health care provider if:  You have swelling or redness in the incision area.  You develop a rash.  You feel lightheaded.  You have pain that is not controlled with medicine.  You have pain, swelling, or redness where the IV access tube was placed. Get help right away if:  You have a fever.  You develop increasing abdominal pain.  You see pus coming out of the incision, or the incision is separating.  You notice a bad smell coming from the wound or dressing.  You have excessive vaginal bleeding.  You feel sick to your stomach (nauseous) and vomit.  You have leg or chest pain.  You have pain when you urinate.  You develop shortness of breath.  You pass out. This information is not intended to replace advice given to you by your health care provider. Make sure you discuss any questions you have with your health care provider. Document Released: 11/19/2008 Document Revised: 12/24/2015 Document Reviewed: 07/17/2012 Elsevier  Interactive Patient Education  [employer]2018 Else[employer] Inc.

## 2017-02-13 NOTE — Progress Notes (Signed)
Called to bedside at approximately 12:50 for chest heaviness, "elephant on my chest", difficulty breathing and left hand numbness. EKG ordered and I went to see pt. Upon arrival pt breathing 20bpm and EKG in process. EKG sinus tach. Hr 110bpm. Pt not endorsing chest pain upon arrival but abdominal pain and head ache and dizziness. Lungs CTAB. Heart: Tachy, no murmurs, S1, S2. Abdomen soft, mild TTP diffusely. Pt brought back to stage I PACU and placed back on monitors. BP and O2 sat WNL. Pt with no cardiac risk factors. Orders given for zofran, reglan, toradol and albuterol nebulizer. Will continue to follow and monitor progress.  Glade Stanford. Resa Rinks, MD

## 2017-02-13 NOTE — Progress Notes (Signed)
Pt c/o feeling as if elephant is sitting on her chest, also stated that her left arm felt like she had slept on it with fingers tingling and felt a little short of breath. VS obtained and Dr. Marcene Duosobert Fitzgerald notified. Order obtained for stat EKG. EKG performed and Dr. Glade Stanford. Fitzgerald by to see patient and EKG.  Pt 's pain better. Pt moved back to PACU for monitoring and administration of additional medications ordered by Dr. Glade Stanford. Fitzgerald. Pt tolerated well..Marland Kitchen

## 2017-02-13 NOTE — Addendum Note (Signed)
Addendum  created 02/13/17 1326 by Marcene DuosFitzgerald, Jahsir Rama, MD   Sign clinical note

## 2017-02-13 NOTE — Addendum Note (Signed)
Addendum  created 02/13/17 1406 by Trevor IhaHouser, Latesa Fratto A, MD   Sign clinical note

## 2017-02-13 NOTE — Addendum Note (Signed)
Addendum  created 02/13/17 1300 by Marcene DuosFitzgerald, Zarielle Cea, MD   Order list changed, Order sets accessed

## 2017-02-14 ENCOUNTER — Encounter (HOSPITAL_BASED_OUTPATIENT_CLINIC_OR_DEPARTMENT_OTHER): Payer: Self-pay | Admitting: Obstetrics and Gynecology

## 2017-02-14 NOTE — Op Note (Signed)
Regina Scott, Regina Scott             ACCOUNT NO.:  [id number]  MEDICAL RECORD NO.:  [id number]  LOCATION:                                 FACILITY:  PHYSICIAN:  Sherron Monday, MD             DATE OF BIRTH:  DATE OF PROCEDURE:  02/13/2017 DATE OF DISCHARGE:                              OPERATIVE REPORT   PREOPERATIVE DIAGNOSES:  Pelvic pain, ovarian cyst.  POSTOPERATIVE DIAGNOSES:  Endometriosis, endometrioma, and pelvic adhesions.  PROCEDURES:  Operative laparoscopy, left salpingo-oophorectomy, lysis of adhesions, fulguration of endometriosis.  SURGEON:  Sherron Monday, MD.  Threasa HeadsJeanice Lim, RNFA.  ANESTHESIA:  Local and general.  EBL:  20 mL.  IV FLUIDS AND URINE OUTPUT:  Per Anesthesia notes.  COMPLICATIONS:  None.  PATHOLOGY:  Left tube and ovary.  DESCRIPTION OF PROCEDURE:  After informed consent was reviewed with the patient and her husband, she was transported back to the OR, where general anesthesia was induced and found to be adequate.  She was then placed in the Yellofin stirrups, prepped and draped in the normal sterile fashion.  A Foley catheter was sterilely placed.  Using an open- sided speculum, a Hulka manipulator was placed on her cervix.  Gown and gloves were changed.  Attention was turned to the abdominal portion of the case and approximately 2 cm infraumbilical incision was made.  The fascia was cleared off with a Tresa Endo and elevated with Kocher clamps. The anterior sheath of the fascia was incised and lifted to get to the posterior sheath.  Posterior sheath was incised and pneumoperitoneum was entered bluntly.  A pursestring was placed around this fascial incision. The Hasson trocar was placed and 20 mL was put in the balloon.  The brief pelvic survey performed, revealed normal liver edge, normal- appearing appendix, large left ovarian cyst, endometriosis in the anterior cul-de-sac and posterior cul-de-sac, ovarian fossa behind the right ovary, and some  scattered on the appendiceals of the bowel.  The accessory ports were placed on both the right and left, 5 mm, under direct visualization after the skin had been instilled with Marcaine. The trocars were placed using an atraumatic grasper and a Harmonic scalpel.  The left tube and ovary were removed.  The ovary had to be peeled from the left sidewall.  There were also adhesions to the bowel appendiceals.  In the process of peeling it off, the ovarian cyst was ruptured and the endometrium has chocolate substance leaked out.  This was copiously irrigated.  The ureters were both visualized following the procedure.  The individual implants of endometriosis were fulgurated using the Harmonic scalpel.  The ports were removed under direct visualization.  The pursestring was used to close the fascial incision at the umbilical port and then a deep suture of 2-0 Vicryl was placed.  The skin on all 3 ports was closed with 4-0 Vicryl in a subcuticular fashion.  Dermabond was applied and covered dermis.  The patient tolerated the procedure well.  Sponge, lap, and needle counts were correct x2 per the operating staff.     Sherron Monday, MD   ______________________________ Sherron Monday, MD    JB/MEDQ  D:  02/13/2017  T:  02/13/2017  Job:  [id number]

## 2017-02-26 ENCOUNTER — Ambulatory Visit (HOSPITAL_COMMUNITY)
Admission: RE | Admit: 2017-02-26 | Discharge: 2017-02-26 | Disposition: A | Discharge status: Home / Self Care | Payer: PRIVATE HEALTH INSURANCE | Source: Ambulatory Visit | Attending: Family Medicine | Admitting: Family Medicine

## 2017-02-26 ENCOUNTER — Other Ambulatory Visit (HOSPITAL_COMMUNITY): Payer: Self-pay | Admitting: Obstetrics and Gynecology

## 2017-02-26 DIAGNOSIS — M79605 Pain in left leg: Secondary | ICD-10-CM | POA: Diagnosis not present

## 2017-02-26 DIAGNOSIS — R52 Pain, unspecified: Secondary | ICD-10-CM

## 2017-02-26 NOTE — Progress Notes (Signed)
*  PRELIMINARY RESULTS* Vascular Ultrasound Left lower extremity venous duplex has been completed.  Preliminary findings: No evidence of deep vein thrombosis or baker's cyst in the left lower extremity.  Preliminary results called to provider, given to [facility] Center[facility] @ 16:38.   Chauncey FischerCharlotte C Amylee Lodato 02/26/2017, 4:37 PM

## 2017-03-19 ENCOUNTER — Encounter: Payer: Self-pay | Admitting: Physical Therapy

## 2017-03-19 ENCOUNTER — Ambulatory Visit: Payer: PRIVATE HEALTH INSURANCE | Attending: Obstetrics and Gynecology | Admitting: Physical Therapy

## 2017-03-19 DIAGNOSIS — M5442 Lumbago with sciatica, left side: Secondary | ICD-10-CM | POA: Insufficient documentation

## 2017-03-19 DIAGNOSIS — M6281 Muscle weakness (generalized): Secondary | ICD-10-CM | POA: Diagnosis present

## 2017-03-19 DIAGNOSIS — R293 Abnormal posture: Secondary | ICD-10-CM | POA: Insufficient documentation

## 2017-03-19 DIAGNOSIS — G8929 Other chronic pain: Secondary | ICD-10-CM | POA: Insufficient documentation

## 2017-03-19 NOTE — Patient Instructions (Signed)
    Quadratus Lumborum stretch in sidelying and Anatomy pic

## 2017-03-19 NOTE — Therapy (Signed)
[facility] [address] [place], [place], [address] Phone: [phone]   Fax:  [phone]  Physical Therapy Evaluation  Patient Details  Name: Regina Scott MRN: [id number] Date of Birth: [birth year] Referring Provider: Dr. Ellyn Hack   Encounter Date: 03/19/2017  PT End of Session - 03/19/17 0941    Visit Number  1    Number of Visits  12    Date for PT Re-Evaluation  04/30/17    PT Start Time  0845    PT Stop Time  0938    PT Time Calculation (min)  53 min    Activity Tolerance  Patient tolerated treatment well    Behavior During Therapy  [facility] for tasks assessed/performed       Past Medical History:  Diagnosis Date  . Constipation   . 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 Heart murmur    per pt was told during pregnancy's was told doctor hears at murmur , no work-up done and asymptomatic  . Ovarian cyst, complex 02/09/2017  . Pelvic pain 02/09/2017  . Wears glasses     Past Surgical History:  Procedure Laterality Date  . DILATION AND CURETTAGE OF UTERUS  2013  approx.   w/  suction for missed ab  . LAPAROSCOPIC SALPINGO OOPHERECTOMY Left 02/13/2017   Procedure: operative laparoscopy, left salpingo oophorectomy, lysis of adhesions, fulgeration of endometriosis;  Surgeon: Sherian Rein, MD;  Location: [facility];  Service: Gynecology;  Laterality: Left;  MD request RNFA    There were no vitals filed for this visit.   Subjective Assessment - 03/19/17 0846    Subjective  Patient has had chronic low back and L leg pain intermittent over the years.  She has some Lt sided knee and hip weakness which is fairly new.  She has pain on L side of back (mid) and it radiates to lower back, into hip but not in buttocks.  L prox hip feels like she has a "sunburn".  Denies red flags.  She has difficulty walking, sleeping.  Pain with standing is increased if she has sat a long time and can't stand over 15 min. She recently  had surgery for endometriosis and has not been active.      Pertinent History  Surgery endometriosis 02/13/17 is on lifting restrictions.     Limitations  Sitting;Lifting;Standing;Walking;House hold activities    How long can you stand comfortably?  15 min     How long can you walk comfortably?  has not done this as she had surgery.  Last summer she was able to run 15 min without incr back pain     Diagnostic tests  None     Patient Stated Goals  Patient would like to know how to manage her pain.     Currently in Pain?  Yes    Pain Score  4     Pain Location  Back    Pain Orientation  Left    Pain Descriptors / Indicators  Tightness;Aching;Other (Comment) pulling    Pain Type  Chronic pain    Pain Radiating Towards  LLE stops at the knee     Pain Onset  More than a month ago    Pain Frequency  Intermittent    Aggravating Factors   activity with kids, lifting, stand and sitting too long , menstrual cycles     Pain Relieving Factors  heating, pillows, rest, sometimes bending forward, popping her back     Effect  of Pain on Daily Activities  limits activity, makes her uncomfortable         [facility] - 03/19/17 0001      Assessment   Medical Diagnosis  L sciatica     Referring Provider  Dr. Ellyn HackBovard    Onset Date/Surgical Date  -- chronic , surgery 02/13/17     Prior Therapy  No       Precautions   Precautions  None      Restrictions   Other Position/Activity Restrictions  Lifting restrictions , 10 lbs       Balance Screen   Has the patient fallen in the past 6 months  No      Home Environment   Living Environment  Private residence    Type of Home  House    Home Access  Stairs to enter    Home Layout  Two level      Prior Function   Level of Independence  Independent    Vocation  Full time employment    Vocation Requirements  walking, sales, i'm all over the place    Leisure  Horseback riding, 2 kids, TV, books       Cognition   Overall Cognitive Status  Within  Functional Limits for tasks assessed      Observation/Other Assessments   Focus on Therapeutic Outcomes (FOTO)   44%      Sensation   Light Touch  Appears Intact    Additional Comments  abnorma lproximal L hip per report , intact        Single Leg Stance   Comments  WNL       Posture/Postural Control   Posture/Postural Control  Postural limitations    Postural Limitations  Rounded Shoulders;Forward head;Increased lumbar lordosis;Anterior pelvic tilt;Left pelvic obliquity    Posture Comments  high L pelvis, L side of spine elevated viewed in standing with full flexiion, mild swayback       AROM   Lumbar Flexion  WFL     Lumbar Extension  WFL    Lumbar - Right Side Bend  WFL stretch on L     Lumbar - Left Side Bend  WFL    Lumbar - Right Rotation  [facility]    Lumbar - Left Rotation  [facility]       Strength   Right Hip Flexion  5/5    Right Hip Extension  4+/5    Right Hip ABduction  4/5    Left Hip Flexion  4/5    Left Hip Extension  3+/5    Left Hip ABduction  3+/5    Right/Left Knee  -- WNL    Right Ankle Dorsiflexion  4+/5    Left Ankle Dorsiflexion  4+/5      Flexibility   Hamstrings  50-55 deg bilat.     Quadriceps  tight on R       Palpation   Palpation comment  pain along L Quadratus lumborum, paraspinals       FABER test   findings  Negative      Prone Knee Bend Test   Findings  Positive    Side  Left      Straight Leg Raise   Findings  Negative      Thomas Test    Findings  Positive    Side  Left             Objective measurements completed on examination: See above findings.  [facility] Adult PT Treatment/Exercise - 03/19/17 0001      Lumbar Exercises: Sidelying   Other Sidelying Lumbar Exercises  L QL leg off table for 1 min       Knee/Hip Exercises: Stretches   Active Hamstring Stretch  Both;2 reps    Hip Flexor Stretch  Both;2 reps      Moist Heat Therapy   Number Minutes Moist Heat  10 Minutes    Moist Heat Location  Lumbar Spine              PT Education - 03/19/17 1423    Education provided  Yes    Education Details  PT/POC, HEP, eval findings     Person(s) Educated  Patient    Methods  Explanation;Handout    Comprehension  Verbalized understanding          PT Long Term Goals - 03/19/17 1424      PT LONG TERM GOAL #1   Title  Pt will be I with HEP for hip flexibility, core and LE strength.     Time  6    Period  Weeks    Status  New    Target Date  04/30/17      PT LONG TERM GOAL #2   Title  Pt will be able to sit, stand and walk as needed without increasing back pain for < 30 min.     Time  6    Period  Weeks    Status  New    Target Date  04/30/17      PT LONG TERM GOAL #3   Title  Pt will be able to lift with proper body mechanics without exacerbating back pain as allowed by MD for normal ADLs and childcare.     Time  6    Period  Weeks    Status  New    Target Date  04/30/17      PT LONG TERM GOAL #4   Title  Pt will be able to improve FOTO score to less than 35% limited to demo improved functional mobility.     Time  6    Period  Weeks    Status  New    Target Date  04/30/17      PT LONG TERM GOAL #5   Title  Pt will demo normal strength in L hip (4+/5 to 5/5) to match the Rt. for improved movement patterning    Time  6    Period  Weeks    Status  New    Target Date  04/30/17             Plan - 03/19/17 1432    Clinical Impression Statement  Pt presents for  complexity eval of chronic intermittent back and leg pain. Since her surgery she has had increase in LE sensation, LE weakness and radiation into LLE.  She has not had these symptoms in the past.  She demonstrated tightness in L quadratus lumborum, L psoas, bilateral quads, bilateral hamstrings.  Her core is weak and she is not as active as she would like to be.  She will benefit from corrective exercises to reduce pain in mid, lower back and leg.      History and Personal Factors relevant to plan of care:  recent  surgery about 4 weeks ago    Clinical Presentation  Evolving    Clinical Presentation due to:  changing, new sx of LE weakness, sensation, radiation into  LLE     Clinical Decision Making  Moderate    Rehab Potential  Excellent    PT Frequency  2x / week    PT Duration  6 weeks    PT Treatment/Interventions  ADLs/Self Care Home Management;Electrical Stimulation;Functional mobility training;Neuromuscular re-education;Taping;Dry needling;Passive range of motion;Manual techniques;Patient/family education;Therapeutic activities;Therapeutic exercise;Moist Heat;Ultrasound;Cryotherapy    PT Next Visit Plan  check HEP, begin core, MHP post     PT Home Exercise Plan  stretching: HS, ant hip, QL    Consulted and Agree with Plan of Care  Patient       Patient will benefit from skilled therapeutic intervention in order to improve the following deficits and impairments:  Decreased strength, Impaired flexibility, Postural dysfunction, Pain, Improper body mechanics, Decreased range of motion, Decreased mobility, Difficulty walking, Impaired sensation  Visit Diagnosis: Chronic bilateral low back pain with left-sided sciatica  Muscle weakness (generalized)  Abnormal posture     Problem List Patient Active Problem List   Diagnosis Date Noted  . 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 Ovarian cyst, complex 02/09/2017  . Pelvic pain 02/09/2017  . Postpartum care following vaginal delivery (5/16) 06/21/2013  . SVD (spontaneous vaginal delivery) 06/21/2013    PAA,JENNIFER 03/19/2017, 2:40 PM  [facility] [address] [place], [place], [address] Phone: [phone]   Fax:  [phone]  Name: Dulcinea Kinser MRN: [id number] Date of Birth: [birth year]   Karie Mainland, PT 03/19/17 2:40 PM Phone: [phone] Fax: [phone]

## 2017-03-27 ENCOUNTER — Ambulatory Visit: Payer: PRIVATE HEALTH INSURANCE | Admitting: Physical Therapy

## 2017-03-27 ENCOUNTER — Encounter: Payer: Self-pay | Admitting: Physical Therapy

## 2017-03-27 DIAGNOSIS — M5442 Lumbago with sciatica, left side: Secondary | ICD-10-CM | POA: Diagnosis not present

## 2017-03-27 DIAGNOSIS — R293 Abnormal posture: Secondary | ICD-10-CM

## 2017-03-27 DIAGNOSIS — G8929 Other chronic pain: Secondary | ICD-10-CM

## 2017-03-27 DIAGNOSIS — M6281 Muscle weakness (generalized): Secondary | ICD-10-CM

## 2017-03-27 NOTE — Patient Instructions (Signed)
L stab handout given from cabinet with anatomy.  Clam, march and SLR

## 2017-03-27 NOTE — Therapy (Signed)
[facility] [address] [place], [place], [address] Phone: [phone]   Fax:  [phone]  Physical Therapy Treatment  Patient Details  Name: Regina Scott MRN: [id number] Date of Birth: 07/15/1980 Referring Provider: Dr. Ellyn HackBovard   Encounter Date: 03/27/2017  PT End of Session - 03/27/17 1102    Visit Number  2    Number of Visits  12    Date for PT Re-Evaluation  04/30/17    PT Start Time  1013    PT Stop Time  1105    PT Time Calculation (min)  52 min    Activity Tolerance  Patient tolerated treatment well    Behavior During Therapy  [facility] for tasks assessed/performed       Past Medical History:  Diagnosis Date  . Constipation   . 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 Heart murmur    per pt was told during pregnancy's was told doctor hears at murmur , no work-up done and asymptomatic  . Ovarian cyst, complex 02/09/2017  . Pelvic pain 02/09/2017  . Wears glasses     Past Surgical History:  Procedure Laterality Date  . DILATION AND CURETTAGE OF UTERUS  2013  approx.   w/  suction for missed ab  . LAPAROSCOPIC SALPINGO OOPHERECTOMY Left 02/13/2017   Procedure: operative laparoscopy, left salpingo oophorectomy, lysis of adhesions, fulgeration of endometriosis;  Surgeon: Sherian ReinBovard-Stuckert, Jody, MD;  [facility];  Service: Gynecology;  Laterality: Left;  MD request RNFA    There were no vitals filed for this visit.  Subjective Assessment - 03/27/17 1119    Subjective  No pain right now.  Has been doing the stretches and it seems to help.      Currently in Pain?  No/denies          [facility] Adult PT Treatment/Exercise - 03/27/17 0001      Lumbar Exercises: Supine   Ab Set  10 reps    Pelvic Tilt  2 reps    Pelvic Tilt Limitations  to find neutral     Clam  10 reps    Clam Limitations  2 sets bi and unilateral     Bent Knee Raise  10 reps    Bridge  10 reps    Basic Lumbar Stabilization  10 reps     Basic Lumbar Stabilization Limitations  SLR vs heel slides       Lumbar Exercises: Sidelying   Other Sidelying Lumbar Exercises  L QL alternatives and ways to do in standing       Knee/Hip Exercises: Stretches   Active Hamstring Stretch  Both;2 reps    Hip Flexor Stretch  Both;2 reps    Piriformis Stretch  Both;2 reps      Moist Heat Therapy   Number Minutes Moist Heat  10 Minutes    Moist Heat Location  Lumbar Spine      Manual Therapy   Manual Therapy  Soft tissue mobilization;Myofascial release    Soft tissue mobilization  L quadratus and paraspinals    Myofascial Release  L trunk, back, hip              PT Education - 03/27/17 1123    Education provided  Yes    Education Details  stabilization, core     Person(s) Educated  Patient    Methods  Explanation;Handout    Comprehension  Verbalized understanding;Returned demonstration;Verbal cues required  PT Long Term Goals - 03/19/17 1424      PT LONG TERM GOAL #1   Title  Pt will be I with HEP for hip flexibility, core and LE strength.     Time  6    Period  Weeks    Status  New    Target Date  04/30/17      PT LONG TERM GOAL #2   Title  Pt will be able to sit, stand and walk as needed without increasing back pain for < 30 min.     Time  6    Period  Weeks    Status  New    Target Date  04/30/17      PT LONG TERM GOAL #3   Title  Pt will be able to lift with proper body mechanics without exacerbating back pain as allowed by MD for normal ADLs and childcare.     Time  6    Period  Weeks    Status  New    Target Date  04/30/17      PT LONG TERM GOAL #4   Title  Pt will be able to improve FOTO score to less than 35% limited to demo improved functional mobility.     Time  6    Period  Weeks    Status  New    Target Date  04/30/17      PT LONG TERM GOAL #5   Title  Pt will demo normal strength in L hip (4+/5 to 5/5) to match the Rt. for improved movement patterning    Time  6    Period   Weeks    Status  New    Target Date  04/30/17            Plan - 03/27/17 1102    Clinical Impression Statement  Pt did well, I with initial stretches.  GIven lumbar stabilization.  L QL very painful, tight along rib attachment and distallly as well.     PT Next Visit Plan  check and progress stab, begin core, MHP post , Manual , DN?     PT Home Exercise Plan  stretching: HS, ant hip, QL, L stab I     Consulted and Agree with Plan of Care  Patient       Patient will benefit from skilled therapeutic intervention in order to improve the following deficits and impairments:  Decreased strength, Impaired flexibility, Postural dysfunction, Pain, Improper body mechanics, Decreased range of motion, Decreased mobility, Difficulty walking, Impaired sensation  Visit Diagnosis: Chronic bilateral low back pain with left-sided sciatica  Muscle weakness (generalized)  Abnormal posture     Problem List Patient Active Problem List   Diagnosis Date Noted  . 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 Ovarian cyst, complex 02/09/2017  . Pelvic pain 02/09/2017  . Postpartum care following vaginal delivery (5/16) 06/21/2013  . SVD (spontaneous vaginal delivery) 06/21/2013    PAA,JENNIFER 03/27/2017, 11:23 AM  [facility] [address] [place], [place], [address] Phone: [phone]   Fax:  [phone]  Name: Regina Scott MRN: [id number] Date of Birth: January 15, 1981  Karie Mainland, PT 03/27/17 11:24 AM Phone: [phone] Fax: [phone]

## 2017-04-02 ENCOUNTER — Ambulatory Visit: Payer: PRIVATE HEALTH INSURANCE | Admitting: Physical Therapy

## 2017-04-02 ENCOUNTER — Encounter: Payer: Self-pay | Admitting: Physical Therapy

## 2017-04-02 DIAGNOSIS — R293 Abnormal posture: Secondary | ICD-10-CM

## 2017-04-02 DIAGNOSIS — M5442 Lumbago with sciatica, left side: Secondary | ICD-10-CM | POA: Diagnosis not present

## 2017-04-02 DIAGNOSIS — G8929 Other chronic pain: Secondary | ICD-10-CM

## 2017-04-02 DIAGNOSIS — M6281 Muscle weakness (generalized): Secondary | ICD-10-CM

## 2017-04-02 NOTE — Therapy (Signed)
[facility] Outpatient Rehabilitation [facility] [address] [place], [place], [address] Phone: [phone]   Fax:  [phone]  Physical Therapy Treatment  Patient Details  Name: Regina Scott MRN: [id number] Date of Birth: [birth year] Referring Provider: Dr. Ellyn Hack   Encounter Date: 04/02/2017  PT End of Session - 04/02/17 1627    Visit Number  3    Number of Visits  12    Date for PT Re-Evaluation  04/30/17    PT Start Time  0940 short session due to patient late,  charge will not equal time slot    PT Stop Time  1015    PT Time Calculation (min)  35 min    Activity Tolerance  Patient tolerated treatment well    Behavior During Therapy  [place] for tasks assessed/performed       Past Medical History:  Diagnosis Date  . Constipation   . 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 Heart murmur    per pt was told during pregnancy's was told doctor hears at murmur , no work-up done and asymptomatic  . Ovarian cyst, complex 02/09/2017  . Pelvic pain 02/09/2017  . Wears glasses     Past Surgical History:  Procedure Laterality Date  . DILATION AND CURETTAGE OF UTERUS  2013  approx.   w/  suction for missed ab  . LAPAROSCOPIC SALPINGO OOPHERECTOMY Left 02/13/2017   Procedure: operative laparoscopy, left salpingo oophorectomy, lysis of adhesions, fulgeration of endometriosis;  Surgeon: Sherian Rein, MD;  Location: [facility];  Service: Gynecology;  Laterality: Left;  MD request RNFA    There were no vitals filed for this visit.  Subjective Assessment - 04/02/17 0942    Subjective    No leg pain now, since she started PT.    Currently in Pain?  Yes    Pain Score  8     Pain Location  Back    Pain Orientation  Left    Pain Descriptors / Indicators  Aching;Tightness    Pain Type  Chronic pain    Pain Relieving Factors  pain meds,  stretching,  rest .  popping back  sometimes     Effect of Pain on Daily Activities  Pain with ADL.                        [facility] Adult PT Treatment/Exercise - 04/02/17 0001      Self-Care   Self-Care  -- QL info  handout reviewed      Lumbar Exercises: Supine   Pelvic Tilt  5 reps    Clam  5 reps   with transversus focus,  each mod cues initially    Bent Knee Raise  5 reps transversus focus    Bridge  -- 3 reps painful different types tried    Straight Leg Raise  10 reps right    Other Supine Lumbar Exercises  Muscle energy       Lumbar Exercises: Sidelying   Other Sidelying Lumbar Exercises  L QL on side 3 X 30 with some manual      Lumbar Exercises: Quadruped   Other Quadruped Lumbar Exercises  child's pose    Other Quadruped Lumbar Exercises  QL stretch moving arms to right from child's pose       Knee/Hip Exercises: Stretches   Passive Hamstring Stretch  1 rep;30 seconds;Both    Quad Stretch  1 rep;30 seconds    Hip Flexor Stretch  2  reps;30 seconds;Right    ITB Stretch  1 rep;30 seconds      Manual Therapy   Manual therapy comments  strumming QL,  rib lift during stretch             PT Education - 04/02/17 0957    Education provided  Yes    Education Details  HEP    Person(s) Educated  Patient    Methods  Explanation;Verbal cues;Handout    Comprehension  Verbalized understanding;Returned demonstration          PT Long Term Goals - 03/19/17 1424      PT LONG TERM GOAL #1   Title  Pt will be I with HEP for hip flexibility, core and LE strength.     Time  6    Period  Weeks    Status  New    Target Date  04/30/17      PT LONG TERM GOAL #2   Title  Pt will be able to sit, stand and walk as needed without increasing back pain for < 30 min.     Time  6    Period  Weeks    Status  New    Target Date  04/30/17      PT LONG TERM GOAL #3   Title  Pt will be able to lift with proper body mechanics without exacerbating back pain as allowed by MD for normal ADLs and childcare.     Time  6    Period  Weeks    Status  New    Target Date   04/30/17      PT LONG TERM GOAL #4   Title  Pt will be able to improve FOTO score to less than 35% limited to demo improved functional mobility.     Time  6    Period  Weeks    Status  New    Target Date  04/30/17      PT LONG TERM GOAL #5   Title  Pt will demo normal strength in L hip (4+/5 to 5/5) to match the Rt. for improved movement patterning    Time  6    Period  Weeks    Status  New    Target Date  04/30/17            Plan - 04/02/17 1629    Clinical Impression Statement  " I feel a lot better"  at end of session.  Able to progress her HEP.  Right ASIS was higher than left in supine.  able to lower partially with exercise.     PT Next Visit Plan  check and progress stab, begin core, MHP post , Manual , DN? Check for ASIS level?    PT Home Exercise Plan  stretching: HS, ant hip, QL, L stab I Quadriped stretch,  Transversus abd with single leg clam and hip/ knee march    Consulted and Agree with Plan of Care  Patient       Patient will benefit from skilled therapeutic intervention in order to improve the following deficits and impairments:     Visit Diagnosis: Chronic bilateral low back pain with left-sided sciatica  Muscle weakness (generalized)  Abnormal posture     Problem List Patient Active Problem List   Diagnosis Date Noted  . 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 Ovarian cyst, complex 02/09/2017  . Pelvic pain 02/09/2017  . Postpartum care following vaginal delivery (5/16) 06/21/2013  . SVD (  spontaneous vaginal delivery) 06/21/2013    HARRIS,KAREN PTA 04/02/2017, 4:37 PM  [facility] [address] [place], [place], [address] Phone: [phone]   Fax:  [phone]  Name: Regina Scott MRN: [id number] Date of Birth: 11/09/1980

## 2017-04-02 NOTE — Patient Instructions (Signed)
Transversus abdominus issued from  Ex drawer  EX 1 , ! A abd 1 B issued

## 2017-04-04 ENCOUNTER — Ambulatory Visit: Payer: PRIVATE HEALTH INSURANCE | Admitting: Physical Therapy

## 2017-04-04 ENCOUNTER — Encounter: Payer: Self-pay | Admitting: Physical Therapy

## 2017-04-04 DIAGNOSIS — M5442 Lumbago with sciatica, left side: Secondary | ICD-10-CM | POA: Diagnosis not present

## 2017-04-04 DIAGNOSIS — G8929 Other chronic pain: Secondary | ICD-10-CM

## 2017-04-04 DIAGNOSIS — R293 Abnormal posture: Secondary | ICD-10-CM

## 2017-04-04 DIAGNOSIS — M6281 Muscle weakness (generalized): Secondary | ICD-10-CM

## 2017-04-04 NOTE — Patient Instructions (Signed)
JOSPT hip strengthening added to HEP from ex drawer Daily, 3-4 x a week 10 x each Pause vs hold All issued

## 2017-04-04 NOTE — Therapy (Signed)
[facility] Outpatient Rehabilitation [facility] [address] [place], [place], [address] Phone: [phone]   Fax:  [phone]  Physical Therapy Treatment  Patient Details  Name: Regina Scott MRN: [id number] Date of Birth: 1980-04-05 Referring Provider: Dr. Ellyn Hack   Encounter Date: 04/04/2017  PT End of Session - 04/04/17 1047    Visit Number  4    Number of Visits  12    Date for PT Re-Evaluation  04/30/17    PT Start Time  0848    PT Stop Time  0930    PT Time Calculation (min)  42 min    Activity Tolerance  Patient tolerated treatment well    Behavior During Therapy  [facility] for tasks assessed/performed       Past Medical History:  Diagnosis Date  . Constipation   . 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 Heart murmur    per pt was told during pregnancy's was told doctor hears at murmur , no work-up done and asymptomatic  . Ovarian cyst, complex 02/09/2017  . Pelvic pain 02/09/2017  . Wears glasses     Past Surgical History:  Procedure Laterality Date  . DILATION AND CURETTAGE OF UTERUS  2013  approx.   w/  suction for missed ab  . LAPAROSCOPIC SALPINGO OOPHERECTOMY Left 02/13/2017   Procedure: operative laparoscopy, left salpingo oophorectomy, lysis of adhesions, fulgeration of endometriosis;  Surgeon: Sherian Rein, MD;  Location: [facility];  Service: Gynecology;  Laterality: Left;  MD request RNFA    There were no vitals filed for this visit.  Subjective Assessment - 04/04/17 0854    Subjective  No pain.  has been doing good posture techniques at home.  has walked stood sat without pain at least  30 minutes.    Currently in Pain?  No/denies was 6-7/10 yesterday    Pain Location  Back    Pain Orientation  Mid    Pain Descriptors / Indicators  Aching    Pain Frequency  Intermittent    Aggravating Factors   longer walking,  increases as day goes on    Pain Relieving Factors  rest                        [facility] Adult  PT Treatment/Exercise - 04/04/17 0001      Ambulation/Gait   Gait Comments  Since pain comes with gait.  had he switch to supportive shoes,  She tends to walk with increased hip/LE IR,  she pounds,   she Tried YOGA walking and it did not improve gait,  I tried Duck walking and hips were more neutral,  cued for TRA with walking.  Slight  core activation with abdominals inproved.       Lumbar Exercises: Standing   Functional Squats  5 reps with and without pole ,  back strain noted without  pole    Other Standing Lumbar Exercises  Hip hinge with and without pole.  Mirror used.  able to decrease strain on back  with neutral spine      Lumbar Exercises: Supine   Ab Set  5 reps With TRA    Pelvic Tilt  5 reps    Clam  5 reps single clams with TRA activation    Single Leg Bridge  -- 10 X right,  8 X left  HEP      Lumbar Exercises: Sidelying   Clam  10 reps cues  more control with right  LE      Lumbar Exercises: Quadruped   Straight Leg Raise  10 reps 2  sets ,, 1 with knee flexed      Knee/Hip Exercises: Stretches   Other Knee/Hip Stretches  inner thigh stretch 3 X 30             PT Education - 04/04/17 1047    Education provided  Yes    Education Details  HEP    Person(s) Educated  Patient    Methods  Explanation;Demonstration;Tactile cues;Verbal cues;Handout    Comprehension  Verbalized understanding;Returned demonstration          PT Long Term Goals - 03/19/17 1424      PT LONG TERM GOAL #1   Title  Pt will be I with HEP for hip flexibility, core and LE strength.     Time  6    Period  Weeks    Status  New    Target Date  04/30/17      PT LONG TERM GOAL #2   Title  Pt will be able to sit, stand and walk as needed without increasing back pain for < 30 min.     Time  6    Period  Weeks    Status  New    Target Date  04/30/17      PT LONG TERM GOAL #3   Title  Pt will be able to lift with proper body mechanics without exacerbating back pain as allowed by MD for  normal ADLs and childcare.     Time  6    Period  Weeks    Status  New    Target Date  04/30/17      PT LONG TERM GOAL #4   Title  Pt will be able to improve FOTO score to less than 35% limited to demo improved functional mobility.     Time  6    Period  Weeks    Status  New    Target Date  04/30/17      PT LONG TERM GOAL #5   Title  Pt will demo normal strength in L hip (4+/5 to 5/5) to match the Rt. for improved movement patterning    Time  6    Period  Weeks    Status  New    Target Date  04/30/17            Plan - 04/04/17 1048    Clinical Impression Statement  Patient has no pain today.  She notes she had normal pain yesterday in central low back.   Hip strengthing focus today with a lot of education about neutral spine.  She was able to decrease strain in low back with neutral spine with squats.  She should be ready to practice lifting next few visits.     PT Next Visit Plan    Review hip strength HEP  Practice lifting.  work toward goals, Manual , DN? if needed    PT Home Exercise Plan  stretching: HS, ant hip, QL, L stab I Quadriped stretch,  Transversus abd with single leg clam and hip/ knee march,  JOSPT hip strengthening.    Consulted and Agree with Plan of Care  Patient       Patient will benefit from skilled therapeutic intervention in order to improve the following deficits and impairments:     Visit Diagnosis: Chronic bilateral low back pain with left-sided sciatica  Muscle weakness (generalized)  Abnormal  posture     Problem List Patient Active Problem List   Diagnosis Date Noted  . 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 Ovarian cyst, complex 02/09/2017  . Pelvic pain 02/09/2017  . Postpartum care following vaginal delivery (5/16) 06/21/2013  . SVD (spontaneous vaginal delivery) 06/21/2013    HARRIS,KAREN  PTA 04/04/2017, 10:57 AM  [facility] [address] [place], [place], [address] Phone:  [phone]   Fax:  [phone]  Name: Regina Scott MRN: [id number] Date of Birth: 09/15/1980

## 2017-04-09 ENCOUNTER — Encounter: Payer: Self-pay | Admitting: Physical Therapy

## 2017-04-09 ENCOUNTER — Ambulatory Visit: Payer: PRIVATE HEALTH INSURANCE | Attending: Family Medicine | Admitting: Physical Therapy

## 2017-04-09 DIAGNOSIS — M5442 Lumbago with sciatica, left side: Secondary | ICD-10-CM | POA: Insufficient documentation

## 2017-04-09 DIAGNOSIS — R293 Abnormal posture: Secondary | ICD-10-CM

## 2017-04-09 DIAGNOSIS — M6281 Muscle weakness (generalized): Secondary | ICD-10-CM | POA: Diagnosis present

## 2017-04-09 DIAGNOSIS — G8929 Other chronic pain: Secondary | ICD-10-CM | POA: Insufficient documentation

## 2017-04-09 NOTE — Therapy (Signed)
[facility] [address] [place], [place], [address] Phone: [phone]   Fax:  [phone]  Physical Therapy Treatment  Patient Details  Name: Regina Scott MRN: [id number] Date of Birth: 08/24/1980 Referring Provider: Dr. Ellyn HackBovard   Encounter Date: 04/09/2017  PT End of Session - 04/09/17 0850    Visit Number  5    Number of Visits  12    Date for PT Re-Evaluation  04/30/17    PT Start Time  0850    PT Stop Time  0930    PT Time Calculation (min)  40 min    Activity Tolerance  Patient tolerated treatment well    Behavior During Therapy  [facility] for tasks assessed/performed       Past Medical History:  Diagnosis Date  . Constipation   . 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 Heart murmur    per pt was told during pregnancy's was told doctor hears at murmur , no work-up done and asymptomatic  . Ovarian cyst, complex 02/09/2017  . Pelvic pain 02/09/2017  . Wears glasses     Past Surgical History:  Procedure Laterality Date  . DILATION AND CURETTAGE OF UTERUS  2013  approx.   w/  suction for missed ab  . LAPAROSCOPIC SALPINGO OOPHERECTOMY Left 02/13/2017   Procedure: operative laparoscopy, left salpingo oophorectomy, lysis of adhesions, fulgeration of endometriosis;  Surgeon: Sherian ReinBovard-Stuckert, Jody, MD;  [facility];  Service: Gynecology;  Laterality: Left;  MD request RNFA    There were no vitals filed for this visit.  Subjective Assessment - 04/09/17 0848    Subjective  Tightness in back, 3/10-4/10.     Currently in Pain?  Yes    Pain Score  4         [facility] Adult PT Treatment/Exercise - 04/09/17 0001      Lumbar Exercises: Stretches   Other Lumbar Stretch Exercise  QL stretch over bolster 1 min       Lumbar Exercises: Supine   Ab Set  10 reps    Pelvic Tilt  10 reps    Pelvic Tilt Limitations  over soft ball     Clam  10 reps    Bent Knee Raise  10 reps    Bridge with Ball Squeeze  10 reps    Other Supine Lumbar Exercises  Pilates Reformer          Pilates Reformer used for LE/core strength, postural strength, lumbopelvic disassociation and core control.  Exercises included: Footwork 2 Red 1 blue parallel and hip ER , single leg same spring for pelvic stability  Quadruped 1 Blue LE only , add press out x 5  Mermaid 1 Red x 5 each side     PT Long Term Goals - 04/09/17 0912      PT LONG TERM GOAL #1   Title  Pt will be I with HEP for hip flexibility, core and LE strength.     Status  On-going      PT LONG TERM GOAL #2   Title  Pt will be able to sit, stand and walk as needed without increasing back pain for < 30 min.     Status  On-going      PT LONG TERM GOAL #3   Title  Pt will be able to lift with proper body mechanics without exacerbating back pain as allowed by MD for normal ADLs and childcare.     Status  On-going  PT LONG TERM GOAL #4   Title  Pt will be able to improve FOTO score to less than 35% limited to demo improved functional mobility.     Status  On-going      PT LONG TERM GOAL #5   Title  Pt will demo normal strength in L hip (4+/5 to 5/5) to match the Rt. for improved movement patterning    Status  Unable to assess            Plan - 04/09/17 0913    Clinical Impression Statement  Patient had some tightness today in L low lumbar and hips .  Able to reduce tightness with simple stretching.  Used soft PIlates ball and Reformer to train stabilizing muscles of lumbopelvic complex.      PT Next Visit Plan    How was Reformer? Review hip strength HEP  Practice lifting.  work toward goals, Manual to L QL     PT Home Exercise Plan  stretching: HS, ant hip, QL, L stab I Quadriped stretch,  Transversus abd with single leg clam and hip/ knee march,  JOSPT hip strengthening.    Consulted and Agree with Plan of Care  Patient       Patient will benefit from skilled therapeutic intervention in order to improve the following deficits and impairments:   Decreased strength, Impaired flexibility, Postural dysfunction, Pain, Improper body mechanics, Decreased range of motion, Decreased mobility, Difficulty walking, Impaired sensation  Visit Diagnosis: Chronic bilateral low back pain with left-sided sciatica  Muscle weakness (generalized)  Abnormal posture     Problem List Patient Active Problem List   Diagnosis Date Noted  . 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 Ovarian cyst, complex 02/09/2017  . Pelvic pain 02/09/2017  . Postpartum care following vaginal delivery (5/16) 06/21/2013  . SVD (spontaneous vaginal delivery) 06/21/2013    Kenji Mapel 04/09/2017, 12:46 PM  [facility] [address] [place], [place], [address] Phone: [phone]   Fax:  [phone]  Name: Regina Scott MRN: [id number] Date of Birth: April 09, 1980   Karie Mainland, PT 04/09/17 12:47 PM Phone: [phone] Fax: [phone]

## 2017-04-11 ENCOUNTER — Ambulatory Visit: Payer: PRIVATE HEALTH INSURANCE | Admitting: Physical Therapy

## 2017-04-16 ENCOUNTER — Encounter: Payer: Self-pay | Admitting: Physical Therapy

## 2017-04-16 ENCOUNTER — Ambulatory Visit: Payer: PRIVATE HEALTH INSURANCE | Admitting: Physical Therapy

## 2017-04-16 DIAGNOSIS — R293 Abnormal posture: Secondary | ICD-10-CM

## 2017-04-16 DIAGNOSIS — G8929 Other chronic pain: Secondary | ICD-10-CM

## 2017-04-16 DIAGNOSIS — M5442 Lumbago with sciatica, left side: Secondary | ICD-10-CM | POA: Diagnosis not present

## 2017-04-16 DIAGNOSIS — M6281 Muscle weakness (generalized): Secondary | ICD-10-CM

## 2017-04-16 NOTE — Patient Instructions (Signed)
Pallof press,  Blue band 10- 30 reps each side  Close band in door,   Hold band with 2 hands With one side toward door,  Walk( side step) away  Until band is tighe Brace middle as hands move straight away from body.   daily

## 2017-04-16 NOTE — Therapy (Signed)
[place], [name], [address] Phone: [phone]   Fax:  [phone]  Physical Therapy Treatment  Patient Details  Name: Regina Scott MRN: [id number] Date of Birth: 13-Mar-1980 Referring Provider: Dr. Melba Coon   Encounter Date: 04/16/2017  PT End of Session - 04/16/17 0937    Visit Number  6    Number of Visits  12    Date for PT Re-Evaluation  04/30/17    PT Start Time  0852    PT Stop Time  0931    PT Time Calculation (min)  39 min    Activity Tolerance  Patient tolerated treatment well    Behavior During Therapy  [facility] for tasks assessed/performed       Past Medical History:  Diagnosis Date  . Constipation   . 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 Heart murmur    per pt was told during pregnancy's was told doctor hears at murmur , no work-up done and asymptomatic  . Ovarian cyst, complex 02/09/2017  . Pelvic pain 02/09/2017  . Wears glasses     Past Surgical History:  Procedure Laterality Date  . DILATION AND CURETTAGE OF UTERUS  2013  approx.   w/  suction for missed ab  . LAPAROSCOPIC SALPINGO OOPHERECTOMY Left 02/13/2017   Procedure: operative laparoscopy, left salpingo oophorectomy, lysis of adhesions, fulgeration of endometriosis;  Surgeon: Janyth Contes, MD;  [facility];  Service: Gynecology;  Laterality: Left;  MD request RNFA    There were no vitals filed for this visit.  Subjective Assessment - 04/16/17 0900    Subjective  No pain.  Has been using good body mechanics with packing home.    Pain returned after about 35 minutes to an hour.      Currently in Pain?  No/denies    Pain Location  Back    Aggravating Factors   dishes,   longer standing a long time.  Carrying 35 LB child on one hip    Pain Relieving Factors  rest    Effect of Pain on Daily Activities  Pain with longer ady                      [facility] Adult PT Treatment/Exercise -  04/16/17 0001      Lumbar Exercises: Stretches   Other Lumbar Stretch Exercise  chils's and Ql stretches 1 X 30 each    Other Lumbar Stretch Exercise  Obliques  hand press 10 X 1 breath      Lumbar Exercises: Machines for Strengthening   Other Lumbar Machine Exercise  Pallof press 10 LBS 2 x 10 reps each side   able to do no pain,  cues initially      Lumbar Exercises: Standing   Functional Squats  5 reps 2 steps to right/left with red band each way Rt/Lt, pole    Other Standing Lumbar Exercises  15,  25 LB kettle bell carry single each  22 feet 6  X,  no increased pain      Lumbar Exercises: Supine   Single Leg Bridge  10 reps both      Lumbar Exercises: Sidelying   Clam  10 reps both ,  red band issued      Lumbar Exercises: Quadruped   Straight Leg Raise  10 reps 2  sets ,, 1 with knee flexed      Manual Therapy   Manual therapy comments  Strumming  left QL during child's pose.  triggerpoint release paraspinal during child's pose.  Painful so stopped at patient's request.              PT Education - 04/16/17 0937    Education provided  Yes    Education Details  HEP,lift carry ed    [employer]) Educated  Patient    Methods  Explanation;Demonstration;Tactile cues;Verbal cues    Comprehension  Verbalized understanding;Returned demonstration          PT Long Term Goals - 04/16/17 1314      PT LONG TERM GOAL #1   Title  Pt will be I with HEP for hip flexibility, core and LE strength.     Baseline   independent with exercises soo far,  she was not doing hip strength exercises consistantly.    Time  6    Period  Weeks    Status  On-going      PT LONG TERM GOAL #2   Title  Pt will be able to sit, stand and walk as needed without increasing back pain for < 30 min.     Baseline   able to sit and walki 30 min.  Unable to stand 30 minutes prior to pain    Time  6    Period  Weeks    Status  Partially Met      PT LONG TERM GOAL #3   Title  Pt will be able to lift with  proper body mechanics without exacerbating back pain as allowed by MD for normal ADLs and childcare.     Baseline  painful at times    Time  6    Period  Weeks    Status  On-going      PT LONG TERM GOAL #4   Title  Pt will be able to improve FOTO score to less than 35% limited to demo improved functional mobility.     Time  6    Period  Weeks    Status  On-going      PT LONG TERM GOAL #5   Title  Pt will demo normal strength in L hip (4+/5 to 5/5) to match the Rt. for improved movement patterning    Baseline   needs to do HEP consistantly,  she is working on this,  not formally tested.    Time  6    Period  Weeks    Status  On-going            Plan - 04/16/17 1309    Clinical Impression Statement  Pain reproduced carrying 25 kettle bell in left hand.  She had arrived without pain.  She tolerated the reformer last visit and would like to continue.  She continues to have pain with longer standing,  carrying child.  Pain now intermittant.   She wants to return to running.  I asked her to try hopping to see if that was pain free prior to light 5 minute jog.     PT Next Visit Plan   Continue Reformer.  See if she was able to light jog.  Review hip strength HEP  Practice lifting/  carrying   endurance (Single kettle bell increases pain) .  work toward goals, Manual to L QL ,  Palof press blue band    PT Home Exercise Plan  stretching: HS, ant hip, QL, L stab I Quadriped stretch,  Transversus abd with single leg clam and hip/ knee march,  JOSPT hip  strengthening.  Pallof press blue,  she will try jogginh 5 minutes if  hops are OK.    Consulted and Agree with Plan of Care  Patient       Patient will benefit from skilled therapeutic intervention in order to improve the following deficits and impairments:     Visit Diagnosis: Chronic bilateral low back pain with left-sided sciatica  Muscle weakness (generalized)  Abnormal posture     Problem List Patient Active Problem List    Diagnosis Date Noted  . 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 Ovarian cyst, complex 02/09/2017  . Pelvic pain 02/09/2017  . Postpartum care following vaginal delivery (5/16) 06/21/2013  . SVD (spontaneous vaginal delivery) 06/21/2013    HARRIS,KAREN PTA 04/16/2017, 1:21 PM  [facility] [address] [place], [name], [address] Phone: [phone]   Fax:  [phone]  Name: Regina Scott MRN: [id number] Date of Birth: [birth year]

## 2017-04-18 ENCOUNTER — Encounter: Payer: Self-pay | Admitting: Physical Therapy

## 2017-04-18 ENCOUNTER — Ambulatory Visit: Payer: PRIVATE HEALTH INSURANCE | Admitting: Physical Therapy

## 2017-04-18 DIAGNOSIS — M5442 Lumbago with sciatica, left side: Principal | ICD-10-CM

## 2017-04-18 DIAGNOSIS — G8929 Other chronic pain: Secondary | ICD-10-CM

## 2017-04-18 DIAGNOSIS — R293 Abnormal posture: Secondary | ICD-10-CM

## 2017-04-18 DIAGNOSIS — M6281 Muscle weakness (generalized): Secondary | ICD-10-CM

## 2017-04-18 NOTE — Therapy (Signed)
[place], [name], [address] Phone: [phone]   Fax:  [phone]  Physical Therapy Treatment  Patient Details  Name: Regina Scott MRN: [id number] Date of Birth: 1980/05/01 Referring Provider: Dr. Melba Coon   Encounter Date: 04/18/2017  PT End of Session - 04/18/17 0908    Visit Number  7    Number of Visits  12    Date for PT Re-Evaluation  04/30/17    PT Start Time  0900 pt 15 min late due to traffic     PT Stop Time  0932    PT Time Calculation (min)  32 min    Activity Tolerance  Patient tolerated treatment well    Behavior During Therapy  [facility] for tasks assessed/performed       Past Medical History:  Diagnosis Date  . Constipation   . 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 Heart murmur    per pt was told during pregnancy's was told doctor hears at murmur , no work-up done and asymptomatic  . Ovarian cyst, complex 02/09/2017  . Pelvic pain 02/09/2017  . Wears glasses     Past Surgical History:  Procedure Laterality Date  . DILATION AND CURETTAGE OF UTERUS  2013  approx.   w/  suction for missed ab  . LAPAROSCOPIC SALPINGO OOPHERECTOMY Left 02/13/2017   Procedure: operative laparoscopy, left salpingo oophorectomy, lysis of adhesions, fulgeration of endometriosis;  Surgeon: Janyth Contes, MD;  Location: [facility];  Service: Gynecology;  Laterality: Left;  MD request RNFA    There were no vitals filed for this visit.  Subjective Assessment - 04/18/17 0901    Subjective  No pain right now.  Hurts when lifting kids (35lbs), mostly after the fact.  She can recover fairly quickly but it depends on how long I hold him.     Currently in Pain?  No/denies         [facility] Adult PT Treatment/Exercise - 04/18/17 0001      Pilates   Pilates Reformer  See Note        Pilates Reformer used for LE/core strength, postural strength, lumbopelvic disassociation and core control.   Exercises included: Supine Arm work 1 Yellow 1 Red Arcs in parallel and scaption, min cues for core  Supine Abs tried chest lift x 5 noticed back strain , modified to extend knees vs lift head  Feet in Straps 1 Red 1 Yellow Arcs in parallel, turnout and circles  Squats 1 Red 1 yellow min cues  Reverse Abdominals 1 Red used short box to ease wrist  Footwork used ball under sacrum for stability challenge   2 Red 1 blue  Parallel and turn out   Single leg same spring x 10      PT Education - 04/18/17 0907    Education provided  Yes    Education Details  Pilates, upper core     Person(s) Educated  Patient    Methods  Explanation;Tactile cues;Verbal cues    Comprehension  Verbalized understanding;Returned demonstration;Need further instruction          PT Long Term Goals - 04/16/17 1314      PT LONG TERM GOAL #1   Title  Pt will be I with HEP for hip flexibility, core and LE strength.     Baseline   independent with exercises soo far,  she was not doing hip strength exercises consistantly.    Time  6  Period  Weeks    Status  On-going      PT LONG TERM GOAL #2   Title  Pt will be able to sit, stand and walk as needed without increasing back pain for < 30 min.     Baseline   able to sit and walki 30 min.  Unable to stand 30 minutes prior to pain    Time  6    Period  Weeks    Status  Partially Met      PT LONG TERM GOAL #3   Title  Pt will be able to lift with proper body mechanics without exacerbating back pain as allowed by MD for normal ADLs and childcare.     Baseline  painful at times    Time  6    Period  Weeks    Status  On-going      PT LONG TERM GOAL #4   Title  Pt will be able to improve FOTO score to less than 35% limited to demo improved functional mobility.     Time  6    Period  Weeks    Status  On-going      PT LONG TERM GOAL #5   Title  Pt will demo normal strength in L hip (4+/5 to 5/5) to match the Rt. for improved movement patterning    Baseline    needs to do HEP consistantly,  she is working on this,  not formally tested.    Time  6    Period  Weeks    Status  On-going            Plan - 04/18/17 0934    Clinical Impression Statement  Worked on lumbopelvic stability on Reformer.  Needs mod cues for maintaining neutral with hip and knee extension.  No pain post.  Gave her info on Pilates mat class for continued work on core.      PT Treatment/Interventions  ADLs/Self Care Home Management;Electrical Stimulation;Functional mobility training;Neuromuscular re-education;Taping;Dry needling;Passive range of motion;Manual techniques;Patient/family education;Therapeutic activities;Therapeutic exercise;Moist Heat;Ultrasound;Cryotherapy    PT Next Visit Plan   FOTO, Continue Reformer.  See if she was able to light jog.  Review hip strength HEP  Practice lifting/  carrying   endurance (Single kettle bell increases pain) .  work toward goals, Manual to L QL ,  Palof press blue band    PT Home Exercise Plan  stretching: HS, ant hip, QL, L stab I Quadriped stretch,  Transversus abd with single leg clam and hip/ knee march,  JOSPT hip strengthening.  Pallof press blue,  she will try jogginh 5 minutes if  hops are OK.    Consulted and Agree with Plan of Care  Patient       Patient will benefit from skilled therapeutic intervention in order to improve the following deficits and impairments:  Decreased strength, Impaired flexibility, Postural dysfunction, Pain, Improper body mechanics, Decreased range of motion, Decreased mobility, Difficulty walking, Impaired sensation  Visit Diagnosis: Chronic bilateral low back pain with left-sided sciatica  Muscle weakness (generalized)  Abnormal posture     Problem List Patient Active Problem List   Diagnosis Date Noted  . 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 Ovarian cyst, complex 02/09/2017  . Pelvic pain 02/09/2017  . Postpartum care following vaginal delivery (5/16) 06/21/2013  . SVD (spontaneous  vaginal delivery) 06/21/2013    PAA,JENNIFER 04/18/2017, 9:36 AM  [place], [name], [address]  Phone: [phone]   Fax:  [phone]  Name: Regina Scott MRN: [id number] Date of Birth: 03-13-1980  Raeford Razor, PT 04/18/17 9:37 AM Phone: [phone] Fax: [phone]

## 2017-04-23 ENCOUNTER — Ambulatory Visit: Payer: PRIVATE HEALTH INSURANCE | Admitting: Physical Therapy

## 2017-04-23 ENCOUNTER — Encounter: Payer: Self-pay | Admitting: Physical Therapy

## 2017-04-23 DIAGNOSIS — M6281 Muscle weakness (generalized): Secondary | ICD-10-CM

## 2017-04-23 DIAGNOSIS — G8929 Other chronic pain: Secondary | ICD-10-CM

## 2017-04-23 DIAGNOSIS — M5442 Lumbago with sciatica, left side: Secondary | ICD-10-CM | POA: Diagnosis not present

## 2017-04-23 DIAGNOSIS — R293 Abnormal posture: Secondary | ICD-10-CM

## 2017-04-23 NOTE — Therapy (Signed)
[facility] [address] [place], [place], [address] Phone: [phone]   Fax:  [phone]  Physical Therapy Treatment  Patient Details  Name: Regina Scott MRN: [id number] Date of Birth: February 14, 1980 Referring Provider: Dr. Ellyn Hack   Encounter Date: 04/23/2017  PT End of Session - 04/23/17 1131    Visit Number  8    Number of Visits  12    Date for PT Re-Evaluation  04/30/17    PT Start Time  0845    PT Stop Time  0944    PT Time Calculation (min)  59 min    Activity Tolerance  Patient tolerated treatment well    Behavior During Therapy  [facility] for tasks assessed/performed       Past Medical History:  Diagnosis Date  . Constipation   . 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 Heart murmur    per pt was told during pregnancy's was told doctor hears at murmur , no work-up done and asymptomatic  . Ovarian cyst, complex 02/09/2017  . Pelvic pain 02/09/2017  . Wears glasses     Past Surgical History:  Procedure Laterality Date  . DILATION AND CURETTAGE OF UTERUS  2013  approx.   w/  suction for missed ab  . LAPAROSCOPIC SALPINGO OOPHERECTOMY Left 02/13/2017   Procedure: operative laparoscopy, left salpingo oophorectomy, lysis of adhesions, fulgeration of endometriosis;  Surgeon: Sherian Rein, MD;  Location: [facility];  Service: Gynecology;  Laterality: Left;  MD request RNFA    There were no vitals filed for this visit.  Subjective Assessment - 04/23/17 0909    Subjective  3/10 pain.  Woke up with.  Has just started moving.  Husband has done the heavy lifting. Carrying is better if I hold them in the middle instead of on my hip.    Currently in Pain?  Yes    Pain Score  3     Pain Location  Back    Pain Orientation  Mid;Left    Pain Descriptors / Indicators  Aching    Pain Radiating Towards  No    Pain Frequency  Intermittent    Aggravating Factors   Carrying 35 LB child wrong.     Pain Relieving Factors   stretch    Effect of Pain on Daily Activities  limits lifting                       [facility] Adult PT Treatment/Exercise - 04/23/17 0001      Lumbar Exercises: Stretches   Passive Hamstring Stretch  3 reps;30 seconds WNL    ITB Stretch  -- while on moist hest,  on right side  heat left    Piriformis Stretch  --    Other Lumbar Stretch Exercise  sitting trunk stretch rotations and side bends    Other Lumbar Stretch Exercise  child's pose,  QL stretch      Lumbar Exercises: Supine   Bent Knee Raise  10 reps left only,  Pelvis shifted opposite    Bridge  20 reps      Lumbar Exercises: Sidelying   Clam  10 reps      Lumbar Exercises: Prone   Single Arm Raise  10 reps over 1 pillow,  no pain    Straight Leg Raise  10 reps over 1 pillow,  no pain    Opposite Arm/Leg Raise  10 reps over 1 pillow no pain    Other  Prone Lumbar Exercises  ball squeeze knees flexed      Lumbar Exercises: Quadruped   Other Quadruped Lumbar Exercises  child's pose    Other Quadruped Lumbar Exercises  QL stretch moving arms to right from child's pose              PT Education - 04/23/17 1130    Education provided  Yes    Education Details  pain control techniques with exercise.    Person(s) Educated  Patient    Methods  Explanation    Comprehension  Verbalized understanding          PT Long Term Goals - 04/23/17 1134      PT LONG TERM GOAL #1   Title  Pt will be I with HEP for hip flexibility, core and LE strength.     Baseline   independent with exercises soo far,  she was not doing hip strength exercises consistantly.    Time  6    Period  Weeks    Status  On-going      PT LONG TERM GOAL #2   Title  Pt will be able to sit, stand and walk as needed without increasing back pain for < 30 min.     Time  6    Period  Weeks    Status  Unable to assess      PT LONG TERM GOAL #3   Title  Pt will be able to lift with proper body mechanics without exacerbating back pain as  allowed by MD for normal ADLs and childcare.     Baseline  painful at times    Time  6    Period  Weeks    Status  On-going      PT LONG TERM GOAL #4   Title  Pt will be able to improve FOTO score to less than 35% limited to demo improved functional mobility.     Time  6    Period  Weeks    Status  On-going      PT LONG TERM GOAL #5   Title  Pt will demo normal strength in L hip (4+/5 to 5/5) to match the Rt. for improved movement patterning    Time  6    Period  Weeks    Status  Unable to assess            Plan - 04/23/17 1132    Clinical Impression Statement  Patient continues to need work on lumbopelvic stability.  Rotation changed from right to left with exercise.  Pain was relieved with prone over pillow.  these exercises were a challange.  Patient is moving and has been trying to lift and carry correctly.     PT Next Visit Plan   FOTO, Continue Reformer.  See if she was able to light jog.  Review hip strength HEP  Practice lifting/  carrying   endurance (Single kettle bell increases pain) .  work toward goals, Manual to L QL ,  Palof press blue band    PT Home Exercise Plan  stretching: HS, ant hip, QL, L stab I Quadriped stretch,  Transversus abd with single leg clam and hip/ knee march,  JOSPT hip strengthening.  Pallof press blue,  she will try jogginh 5 minutes if  hops are OK.    Consulted and Agree with Plan of Care  Patient       Patient will benefit from skilled therapeutic intervention in order  to improve the following deficits and impairments:     Visit Diagnosis: Chronic bilateral low back pain with left-sided sciatica  Muscle weakness (generalized)  Abnormal posture     Problem List Patient Active Problem List   Diagnosis Date Noted  . 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 Ovarian cyst, complex 02/09/2017  . Pelvic pain 02/09/2017  . Postpartum care following vaginal delivery (5/16) 06/21/2013  . SVD (spontaneous vaginal delivery) 06/21/2013     HARRIS,KAREN PTA 04/23/2017, 11:35 AM  [facility] [address] [place], [place], [address] Phone: [phone]   Fax:  [phone]  Name: Margerie Fraiser MRN: [id number] Date of Birth: 12/07/1980

## 2017-04-25 ENCOUNTER — Ambulatory Visit: Payer: PRIVATE HEALTH INSURANCE | Admitting: Physical Therapy

## 2017-04-25 ENCOUNTER — Encounter: Payer: Self-pay | Admitting: Physical Therapy

## 2017-04-25 DIAGNOSIS — M5442 Lumbago with sciatica, left side: Secondary | ICD-10-CM | POA: Diagnosis not present

## 2017-04-25 DIAGNOSIS — M6281 Muscle weakness (generalized): Secondary | ICD-10-CM

## 2017-04-25 DIAGNOSIS — G8929 Other chronic pain: Secondary | ICD-10-CM

## 2017-04-25 DIAGNOSIS — R293 Abnormal posture: Secondary | ICD-10-CM

## 2017-04-25 NOTE — Therapy (Signed)
[facility] [address] [place], [place], [address] Phone: [phone]   Fax:  [phone]  Physical Therapy Treatment  Patient Details  Name: Regina Scott MRN: [id number] Date of Birth: 1980/02/17 Referring Provider: Dr. Ellyn Hack   Encounter Date: 04/25/2017  PT End of Session - 04/25/17 0921    Visit Number  9    Number of Visits  12    Date for PT Re-Evaluation  04/30/17    PT Start Time  0846    PT Stop Time  0940    PT Time Calculation (min)  54 min    Activity Tolerance  Patient tolerated treatment well    Behavior During Therapy  [facility] for tasks assessed/performed       Past Medical History:  Diagnosis Date  . Constipation   . 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 Heart murmur    per pt was told during pregnancy's was told doctor hears at murmur , no work-up done and asymptomatic  . Ovarian cyst, complex 02/09/2017  . Pelvic pain 02/09/2017  . Wears glasses     Past Surgical History:  Procedure Laterality Date  . DILATION AND CURETTAGE OF UTERUS  2013  approx.   w/  suction for missed ab  . LAPAROSCOPIC SALPINGO OOPHERECTOMY Left 02/13/2017   Procedure: operative laparoscopy, left salpingo oophorectomy, lysis of adhesions, fulgeration of endometriosis;  Surgeon: Sherian Rein, MD;  Location: [facility];  Service: Gynecology;  Laterality: Left;  MD request RNFA    There were no vitals filed for this visit.  Subjective Assessment - 04/25/17 0848    Subjective  Pain L side, the same.  Hasnt done any lifting since the weekend.      Currently in Pain?  Yes    Pain Score  3     Pain Location  Back    Pain Orientation  Left    Pain Type  Chronic pain    Pain Onset  More than a month ago    Pain Frequency  Intermittent    Aggravating Factors   poor lifting, AM hours, heavylifting     Pain Relieving Factors  stretch, heat, massage          [facility] PT Assessment - 04/25/17 0001       Observation/Other Assessments   Focus on Therapeutic Outcomes (FOTO)   28%        Pilates Tower for LE/Core strength, postural strength, lumbopelvic disassociation and core control.   [facility] Adult PT Treatment/Exercise - 04/25/17 0001      Pilates   Pilates Reformer  --    Pilates Tower  Tower: Roll down yellow x 8, Sidelying legs yellow springs: adduction/abduction, hip/knee flexion and sidekick series (hip flex/ext)       Lumbar Exercises: Supine   Clam  10 reps    Clam Limitations  uni and bilat.     Basic Lumbar Stabilization  10 reps    Basic Lumbar Stabilization Limitations  done on foam roller: UE horiz pull, alternating flexion, clam, march and bent knee raise     Other Supine Lumbar Exercises  single arm dumbell 5 lbs, lat pull down 5 lbs bilater. on foam roller       Lumbar Exercises: Quadruped   Other Quadruped Lumbar Exercises  child's pose      Moist Heat Therapy   Number Minutes Moist Heat  10 Minutes    Moist Heat Location  Lumbar Spine  PT Education - 04/25/17 [id number]    Education provided  Yes    Education Details  Pilates Tower concepts, stability , foam roller     Person(s) Educated  Patient    Methods  Explanation    Comprehension  Verbalized understanding;Need further instruction          PT Long Term Goals - 04/25/17 0925      PT LONG TERM GOAL #1   Title  Pt will be I with HEP for hip flexibility, core and LE strength.     Status  On-going      PT LONG TERM GOAL #2   Title  Pt will be able to sit, stand and walk as needed without increasing back pain for < 30 min.     Baseline   able to sit and walki 30 min.  Unable to stand 30 minutes prior to pain    Status  On-going      PT LONG TERM GOAL #3   Title  Pt will be able to lift with proper body mechanics without exacerbating back pain as allowed by MD for normal ADLs and childcare.     Status  On-going      PT LONG TERM GOAL #4   Title  Pt will be able to improve FOTO score  to less than 35% limited to demo improved functional mobility.     Status  Achieved      PT LONG TERM GOAL #5   Title  Pt will demo normal strength in L hip (4+/5 to 5/5) to match the Rt. for improved movement patterning    Baseline  extension bilaterally 4/5. L hip abd 4/5, Rt. hip abd 4+/5     Status  On-going            Plan - 04/25/17 9604    Clinical Impression Statement  Challenged core and pelvic stability.  IMproved FOTO score to 28%.  Hips are stronger but she could use more time to improve core strength and integrate core into lifting body mechanics.      PT Frequency  1x / week    PT Duration  6 weeks    PT Treatment/Interventions  ADLs/Self Care Home Management;Electrical Stimulation;Functional mobility training;Neuromuscular re-education;Taping;Dry needling;Passive range of motion;Manual techniques;Patient/family education;Therapeutic activities;Therapeutic exercise;Moist Heat;Ultrasound;Cryotherapy    PT Next Visit Plan   Continue Reformer/Tower  See if she was able to light jog.  Review hip strength HEP  Practice lifting/  carrying   endurance (Single kettle bell increases pain) .  work toward goals, Manual to L QL ,  Palof press blue band    PT Home Exercise Plan  stretching: HS, ant hip, QL, L stab I Quadriped stretch,  Transversus abd with single leg clam and hip/ knee march,  JOSPT hip strengthening.  Pallof press blue,  she will try jogginh 5 minutes if  hops are OK.    Consulted and Agree with Plan of Care  Patient       Patient will benefit from skilled therapeutic intervention in order to improve the following deficits and impairments:  Decreased strength, Impaired flexibility, Postural dysfunction, Pain, Improper body mechanics, Decreased range of motion, Decreased mobility, Difficulty walking, Impaired sensation  Visit Diagnosis: Chronic bilateral low back pain with left-sided sciatica  Muscle weakness (generalized)  Abnormal posture     Problem  List Patient Active Problem List   Diagnosis Date Noted  . 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 Ovarian cyst, complex 02/09/2017  .  Pelvic pain 02/09/2017  . Postpartum care following vaginal delivery (5/16) 06/21/2013  . SVD (spontaneous vaginal delivery) 06/21/2013    PAA,JENNIFER 04/25/2017, 9:34 AM  [facility] [address] [place], [place], [address] Phone: [phone]   Fax:  [phone]  Name: Leane ParaDonna Vicknair MRN: [id number] Date of Birth: 04/18/1980  Karie MainlandJennifer Paa, PT 04/25/17 9:34 AM Phone: [phone] Fax: [phone]

## 2017-04-25 NOTE — Addendum Note (Signed)
Addended by: Karie MainlandPAA, Orlondo Holycross L on: 04/25/2017 09:36 AM   Modules accepted: Orders

## 2017-05-01 ENCOUNTER — Encounter: Payer: Self-pay | Admitting: Physical Therapy

## 2017-05-01 ENCOUNTER — Ambulatory Visit: Payer: PRIVATE HEALTH INSURANCE | Admitting: Physical Therapy

## 2017-05-01 DIAGNOSIS — M5442 Lumbago with sciatica, left side: Principal | ICD-10-CM

## 2017-05-01 DIAGNOSIS — R293 Abnormal posture: Secondary | ICD-10-CM

## 2017-05-01 DIAGNOSIS — M6281 Muscle weakness (generalized): Secondary | ICD-10-CM

## 2017-05-01 DIAGNOSIS — G8929 Other chronic pain: Secondary | ICD-10-CM

## 2017-05-01 NOTE — Therapy (Signed)
[facility] [address] [place], [place], [address] Phone: [phone]   Fax:  [phone]  Physical Therapy Treatment  Patient Details  Name: Regina Scott MRN: [id number] Date of Birth: 1980-12-14 Referring Provider: Dr. Ellyn Hack   Encounter Date: 05/01/2017  PT End of Session - 05/01/17 0930    Visit Number  10    Number of Visits  12    Date for PT Re-Evaluation  04/30/17    PT Start Time  0850    PT Stop Time  0940    PT Time Calculation (min)  50 min    Activity Tolerance  Patient tolerated treatment well    Behavior During Therapy  [facility] for tasks assessed/performed       Past Medical History:  Diagnosis Date  . Constipation   . 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 Heart murmur    per pt was told during pregnancy's was told doctor hears at murmur , no work-up done and asymptomatic  . Ovarian cyst, complex 02/09/2017  . Pelvic pain 02/09/2017  . Wears glasses     Past Surgical History:  Procedure Laterality Date  . DILATION AND CURETTAGE OF UTERUS  2013  approx.   w/  suction for missed ab  . LAPAROSCOPIC SALPINGO OOPHERECTOMY Left 02/13/2017   Procedure: operative laparoscopy, left salpingo oophorectomy, lysis of adhesions, fulgeration of endometriosis;  Surgeon: Sherian Rein, MD;  Location: [facility];  Service: Gynecology;  Laterality: Left;  MD request RNFA    There were no vitals filed for this visit.  Subjective Assessment - 05/01/17 0852    Subjective  Left side has been hurting ever since last visit .  May have been the foam roller.  back did not seem right after she got off.  I get a sharp pain every tim she bears weight  on left leg while carrying baby.   IAll the hevy things have been moved in.     Currently in Pain?  Yes    Pain Score  5  up to 7-8/10 last few days    Pain Location  Back    Pain Orientation  Left;Mid;Lower    Pain Descriptors / Indicators  Sharp;Aching;Tightness     Pain Radiating Towards  mid buttocks    Pain Relieving Factors  stretches    Effect of Pain on Daily Activities  sits to the side at work                No data recorded       [facility] Adult PT Treatment/Exercise - 05/01/17 0001      Posture/Postural Control   Posture Comments  stands with hips left,  supine Left ASIS higher initially      Lumbar Exercises: Stretches   Other Lumbar Stretch Exercise  diagonal child's pose 3 X 30 decreased pain stretching left      Lumbar Exercises: Supine   Clam  10 reps    Clam Limitations  uni and bilat.  cues monitored    Bridge  20 reps    Basic Lumbar Stabilization  10 reps    Basic Lumbar Stabilization Limitations  on mat vs roller.    Let UE has less rom than right in flexion Green band horizontal pulls ,  alternating flexion extension,     Straight Leg Raise  10 reps 2 sets left only,  cues    Isometric Hip Flexion  5 reps;5 seconds left ,  muscle energy  Large Ball Abdominal Isometric  10 reps cued monitored for abdominal    Other Supine Lumbar Exercises  TRA with 10 breaths      Cryotherapy   Number Minutes Cryotherapy  10 Minutes    Cryotherapy Location  Lumbar Spine    Type of Cryotherapy  -- cold pack, extra pillowcase,  left puffy              PT Education - 05/01/17 0930    Education provided  Yes    Education Details  use of ice    Person(s) Educated  Patient    Methods  Explanation    Comprehension  Verbalized understanding          PT Long Term Goals - 05/01/17 0932      PT LONG TERM GOAL #1   Title  Pt will be I with HEP for hip flexibility, core and LE strength.     Baseline   independent with exercises soo far,  Does not get to all,  she is moving    Time  6    Period  Weeks    Status  On-going      PT LONG TERM GOAL #2   Title  Pt will be able to sit, stand and walk as needed without increasing back pain for < 30 min.     Time  6    Period  Weeks    Status  Unable to assess      PT  LONG TERM GOAL #3   Title  Pt will be able to lift with proper body mechanics without exacerbating back pain as allowed by MD for normal ADLs and childcare.     Time  6    Period  Weeks    Status  On-going      PT LONG TERM GOAL #4   Title  Pt will be able to improve FOTO score to less than 35% limited to demo improved functional mobility.     Time  6    Period  Weeks    Status  Achieved      PT LONG TERM GOAL #5   Title  Pt will demo normal strength in L hip (4+/5 to 5/5) to match the Rt. for improved movement patterning    Time  6    Period  Weeks    Status  Unable to assess            Plan - 05/01/17 0930    Clinical Impression Statement  QL tight initially.  Able to decrease pain with exercise and stretching,  Trial of ice today.  Low back edematous left    PT Next Visit Plan   Continue Reformer/Tower  See if she was able to light jog.  Review hip strength HEP  Practice lifting/  carrying   endurance (Single kettle bell increases pain) .  work toward goals, Manual to L QL ,  Palof press blue band    PT Home Exercise Plan  stretching: HS, ant hip, QL, L stab I Quadriped stretch,  Transversus abd with single leg clam and hip/ knee march,  JOSPT hip strengthening.  Pallof press blue,  she will try jogginh 5 minutes if  hops are OK.    Consulted and Agree with Plan of Care  Patient       Patient will benefit from skilled therapeutic intervention in order to improve the following deficits and impairments:     Visit Diagnosis: Chronic bilateral  low back pain with left-sided sciatica  Muscle weakness (generalized)  Abnormal posture     Problem List Patient Active Problem List   Diagnosis Date Noted  . 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 Ovarian cyst, complex 02/09/2017  . Pelvic pain 02/09/2017  . Postpartum care following vaginal delivery (5/16) 06/21/2013  . SVD (spontaneous vaginal delivery) 06/21/2013    HARRIS,KAREN  PTA 05/01/2017, 9:34 AM  [facility] [address] [place], [place], [address] Phone: [phone]   Fax:  [phone]  Name: Leane ParaDonna Scott MRN: [id number] Date of Birth: 08/10/1980

## 2017-05-09 ENCOUNTER — Ambulatory Visit: Payer: PRIVATE HEALTH INSURANCE | Admitting: Physical Therapy

## 2017-05-16 ENCOUNTER — Ambulatory Visit: Payer: PRIVATE HEALTH INSURANCE | Attending: Family Medicine | Admitting: Physical Therapy

## 2017-05-16 ENCOUNTER — Encounter: Payer: Self-pay | Admitting: Physical Therapy

## 2017-05-16 DIAGNOSIS — M5442 Lumbago with sciatica, left side: Secondary | ICD-10-CM | POA: Insufficient documentation

## 2017-05-16 DIAGNOSIS — G8929 Other chronic pain: Secondary | ICD-10-CM | POA: Insufficient documentation

## 2017-05-16 DIAGNOSIS — M6281 Muscle weakness (generalized): Secondary | ICD-10-CM | POA: Diagnosis present

## 2017-05-16 DIAGNOSIS — R293 Abnormal posture: Secondary | ICD-10-CM | POA: Diagnosis present

## 2017-05-16 NOTE — Patient Instructions (Signed)
Hip Flexor Stretch    Lying on back near edge of bed, bend one leg, foot flat. Hang other leg over edge, relaxed, thigh resting entirely on bed for _5___ minutes. Repeat __1__ times. Do 1-2____ sessions per day. Advanced Exercise: Bend knee back keeping thigh in contact with bed.  Or 3 X 30 seconds  Keep back tight to bed  http://gt2.exer.us/347   Copyright  VHI. All rights reserved.

## 2017-05-16 NOTE — Therapy (Addendum)
[name], [name], [address] Phone: [phone]   Fax:  [phone]  Physical Therapy Treatment Addended Discharge   Patient Details  Name: Regina Scott MRN: [id number] Date of Birth: 1980-11-19 Referring Provider: Dr. Melba Coon   Encounter Date: 05/16/2017  PT End of Session - 05/16/17 0940    Visit Number  11    Number of Visits  12    PT Start Time  0848    PT Stop Time  0930    PT Time Calculation (min)  42 min    Activity Tolerance  Patient tolerated treatment well    Behavior During Therapy  [facility] [address] for tasks assessed/performed       Past Medical History:  Diagnosis Date  . Constipation   . 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 Heart murmur    per pt was told during pregnancy's was told doctor hears at murmur , no work-up done and asymptomatic  . Ovarian cyst, complex 02/09/2017  . Pelvic pain 02/09/2017  . Wears glasses     Past Surgical History:  Procedure Laterality Date  . DILATION AND CURETTAGE OF UTERUS  2013  approx.   w/  suction for missed ab  . LAPAROSCOPIC SALPINGO OOPHERECTOMY Left 02/13/2017   Procedure: operative laparoscopy, left salpingo oophorectomy, lysis of adhesions, fulgeration of endometriosis;  Surgeon: Janyth Contes, MD;  Location: [facility];  Service: Gynecology;  Laterality: Left;  MD request RNFA    There were no vitals filed for this visit.  Subjective Assessment - 05/16/17 0850    Subjective  Has had agood week.  Has been using good posture and when she starts to get pain she stretches right away.  Less sharp/ aching pain    Currently in Pain?  No/denies    Pain Score  -- up go 6-7/10    Pain Location  Back left hip pops when it is behind her  with walking it does noit hurt  feels like joint pop    Pain Orientation  Left;Mid;Lower    Pain Descriptors / Indicators  Tightness;Sharp;Aching    Pain Type  Chronic pain    Pain Radiating Towards   rare mid buttocks    Pain Frequency  Intermittent    Aggravating Factors   longer standing  sometimes lifting baby hurts.     Pain Relieving Factors  stretching    Multiple Pain Sites  -- occasionally left shoulder from purse                       Regina Scott Adult PT Treatment/Exercise - 05/16/17 0001      Lumbar Exercises: Stretches   Single Knee to Chest Stretch  3 reps;30 seconds left anterior hip feels like it wants to pop,  not on right    Hip Flexor Stretch  30 seconds supinr lunge and stand/ ankle hold,  supine best  HEP    Pelvic Tilt  5 reps    Other Lumbar Stretch Exercise  diagonal child's pose 3 X 30 decreased pain stretching left 2 sets as pain flares      Lumbar Exercises: Aerobic   Recumbent Bike  L3 5 minutes 1.1 mile      Lumbar Exercises: Supine   Bridge  -- level 1 pilates,  shoulder bridge.cued   5-  6 repssome disc    Other Supine Lumbar Exercises  Scissors  Level 1.  coordinating a challange  Lumbar Exercises: Sidelying   Clam  10 reps level 1 pilates, and level 2  left wobbly but able             PT Education - 05/16/17 0937    Education provided  Yes    Education Details  anterior hip stretch    Person(s) Educated  Patient    Methods  Explanation;Demonstration;Tactile cues;Verbal cues;Handout    Comprehension  Verbalized understanding;Returned demonstration          PT Long Term Goals - 05/16/17 1140      PT LONG TERM GOAL #1   Title  Pt will be I with HEP for hip flexibility, core and LE strength.     Baseline  independent with exercises issued so far    Time  6    Period  Weeks    Status  On-going      PT LONG TERM GOAL #2   Title  Pt will be able to sit, stand and walk as needed without increasing back pain for < 30 min.     Baseline  longer standing increased pain 6/10    Time  6    Period  Weeks    Status  On-going      PT LONG TERM GOAL #3   Title  Pt will be able to lift with proper body mechanics without  exacerbating back pain as allowed by MD for normal ADLs and childcare.     Baseline  painful at times    Time  6    Period  Weeks    Status  On-going      PT LONG TERM GOAL #4   Title  Pt will be able to improve FOTO score to less than 35% limited to demo improved functional mobility.     Time  6      PT LONG TERM GOAL #5   Title  Pt will demo normal strength in L hip (4+/5 to 5/5) to match the Rt. for improved movement patterning    Time  6    Period  Weeks    Status  Unable to assess            Plan - 05/16/17 0941    Clinical Impression Statement  stabilization continues.  Noted tightness anterior hip left.  so addressed this with stretching.  No pain at end of session.    PT Next Visit Plan   Continue Reformer/Tower  Review hip stretch HEP  Practice lifting/  carrying   endurance (Single kettle bell increases pain) .  work toward goals, Manual to L QL ,  Palof press blue band       Patient will benefit from skilled therapeutic intervention in order to improve the following deficits and impairments:     Visit Diagnosis: Chronic bilateral low back pain with left-sided sciatica  Muscle weakness (generalized)  Abnormal posture     Problem List Patient Active Problem List   Diagnosis Date Noted  . 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 Ovarian cyst, complex 02/09/2017  . Pelvic pain 02/09/2017  . Postpartum care following vaginal delivery (5/16) 06/21/2013  . SVD (spontaneous vaginal delivery) 06/21/2013    Hutch Rhett  PTA 05/16/2017, 11:42 AM  [place], [name], [address] Phone: [phone]   Fax:  [phone]  Name: Regina Scott MRN: [id number] Date of Birth: 09/17/1980  PHYSICAL THERAPY DISCHARGE SUMMARY  Visits from Start of Care: 11  Current  functional level related to goals / functional outcomes: See above    Remaining deficits: Weakness, core, ROM    Education /  Equipment: HEP, core, posture, lifting  Plan: Patient agrees to discharge.  Patient goals were partially met. Patient is being discharged due to not returning since the last visit.  ?????    Raeford Razor, PT 06/26/17 12:19 PM Phone: [phone] Fax: [phone]

## 2017-05-23 ENCOUNTER — Ambulatory Visit: Payer: PRIVATE HEALTH INSURANCE | Admitting: Physical Therapy

## 2017-05-30 ENCOUNTER — Telehealth: Payer: Self-pay | Admitting: Physical Therapy

## 2017-05-30 ENCOUNTER — Ambulatory Visit: Payer: PRIVATE HEALTH INSURANCE | Admitting: Physical Therapy

## 2017-05-30 NOTE — Telephone Encounter (Signed)
Called patient about missed visit.  Her daughter was sick and throwing up.  She was having a crazy morning.  She said she plans to attend the next appointment May 1st, at 8:45. Liz BeachKaren Harris PTA

## 2017-06-06 ENCOUNTER — Ambulatory Visit: Payer: PRIVATE HEALTH INSURANCE | Admitting: Physical Therapy

## 2018-01-24 ENCOUNTER — Encounter (HOSPITAL_COMMUNITY): Payer: Self-pay | Admitting: *Deleted

## 2018-01-24 ENCOUNTER — Inpatient Hospital Stay (HOSPITAL_COMMUNITY): Payer: PRIVATE HEALTH INSURANCE

## 2018-01-24 ENCOUNTER — Inpatient Hospital Stay (HOSPITAL_COMMUNITY)
Admission: AD | Admit: 2018-01-24 | Discharge: 2018-01-24 | Disposition: A | Discharge status: Home / Self Care | Payer: PRIVATE HEALTH INSURANCE | Attending: Obstetrics and Gynecology | Admitting: Obstetrics and Gynecology

## 2018-01-24 DIAGNOSIS — Z3202 Encounter for pregnancy test, result negative: Secondary | ICD-10-CM | POA: Insufficient documentation

## 2018-01-24 DIAGNOSIS — Z880 Allergy status to penicillin: Secondary | ICD-10-CM | POA: Insufficient documentation

## 2018-01-24 DIAGNOSIS — R102 Pelvic and perineal pain: Secondary | ICD-10-CM

## 2018-01-24 DIAGNOSIS — N946 Dysmenorrhea, unspecified: Secondary | ICD-10-CM

## 2018-01-24 LAB — URINALYSIS, ROUTINE W REFLEX MICROSCOPIC
Bacteria, UA: NONE SEEN
Bilirubin Urine: NEGATIVE
Glucose, UA: NEGATIVE mg/dL
Ketones, ur: NEGATIVE mg/dL
Leukocytes, UA: NEGATIVE
Nitrite: NEGATIVE
Protein, ur: NEGATIVE mg/dL
Specific Gravity, Urine: 1.02 (ref 1.005–1.030)
pH: 7 (ref 5.0–8.0)

## 2018-01-24 LAB — CBC
HEMATOCRIT: 39.6 % (ref 36.0–46.0)
Hemoglobin: 12.9 g/dL (ref 12.0–15.0)
MCH: 30.9 pg (ref 26.0–34.0)
MCHC: 32.6 g/dL (ref 30.0–36.0)
MCV: 95 fL (ref 80.0–100.0)
NRBC: 0 % (ref 0.0–0.2)
Platelets: 353 10*3/uL (ref 150–400)
RBC: 4.17 MIL/uL (ref 3.87–5.11)
RDW: 13.1 % (ref 11.5–15.5)
WBC: 7.8 10*3/uL (ref 4.0–10.5)

## 2018-01-24 LAB — POCT PREGNANCY, URINE: PREG TEST UR: NEGATIVE

## 2018-01-24 LAB — WET PREP, GENITAL
Clue Cells Wet Prep HPF POC: NONE SEEN
Sperm: NONE SEEN
Trich, Wet Prep: NONE SEEN
Yeast Wet Prep HPF POC: NONE SEEN

## 2018-01-24 MED ORDER — KETOROLAC TROMETHAMINE 30 MG/ML IJ SOLN
30.0000 mg | Freq: Once | INTRAMUSCULAR | Status: AC
  Administered 2018-01-24: 30 mg via INTRAMUSCULAR
  Filled 2018-01-24: qty 1

## 2018-01-24 MED ORDER — IBUPROFEN 800 MG PO TABS: 800.0000 mg | ORAL_TABLET | Freq: Three times a day (TID) | ORAL | 0 refills | Status: DC | PRN

## 2018-01-24 NOTE — MAU Note (Signed)
Pt had cyst on L ovary last January, had ovarian torsion, ovary removed.  Has since had 2 cysts on R side, also endometriosis.  Started having similar pain on R side 1 1/2 weeks ago, had brown discharge, now is more red.  Also having back pain.  Has passed 3 clots this a.m., silver dollar size, bleeding is heavy.

## 2018-01-24 NOTE — MAU Provider Note (Signed)
History     CSN: [id number]  Arrival date and time: 01/24/18 1046   First Provider Initiated Contact with Patient 01/24/18 1124      Chief Complaint  Patient presents with  . Abdominal Pain  . Vaginal Bleeding   37 y.o. female here with pelvic pain. Pain started about 1 week ago. Pain has been intermittent and sharp, and mostly on right side. She reports this is similar pain to a left ovarian cyst and torsion she had earl;ier this year which required BSO. She has not taken anything for the pain. Reports onset of brown spotting for several days then bright red VB over the last week. Today the bleeding was heavier and she passed golf ball sized clots. Used 2 pads today, not fully saturated. Denies urinary sx. She has not been sexually active in 1 year.    Past Medical History:  Diagnosis Date  . Constipation   . 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 Heart murmur    per pt was told during pregnancy's was told doctor hears at murmur , no work-up done and asymptomatic  . Ovarian cyst, complex 02/09/2017  . Pelvic pain 02/09/2017  . Wears glasses     Past Surgical History:  Procedure Laterality Date  . DILATION AND CURETTAGE OF UTERUS  2013  approx.   w/  suction for missed ab  . LAPAROSCOPIC SALPINGO OOPHERECTOMY Left 02/13/2017   Procedure: operative laparoscopy, left salpingo oophorectomy, lysis of adhesions, fulgeration of endometriosis;  Surgeon: Sherian ReinBovard-Stuckert, Jody, MD;  [facility];  Service: Gynecology;  Laterality: Left;  MD request RNFA    Family History  Problem Relation Age of Onset  . Cancer Mother        breast  . COPD Father   . Diabetes Maternal Grandmother   . Heart attack Maternal Grandmother     Social History   Tobacco Use  . Smoking status: Never Smoker  . Smokeless tobacco: Never Used  Substance Use Topics  . Alcohol use: No  . Drug use: No    Allergies:  Allergies  Allergen Reactions  . Beef-Derived Products Nausea And  Vomiting    "EXTREMELY SICK"  . Penicillins Rash    Medications Prior to Admission  Medication Sig Dispense Refill Last Dose  . cetirizine (ZYRTEC) 10 MG tablet Take 10 mg by mouth daily as needed.    Not Taking  . oxyCODONE-acetaminophen (ROXICET) 5-325 MG tablet Take 1-2 tablets by mouth every 6 (six) hours as needed for severe pain. (Patient not taking: Reported on 03/19/2017) 15 tablet 0 Not Taking  . rizatriptan (MAXALT) 10 MG tablet Take 10 mg by mouth as needed for migraine. May repeat in 2 hours if needed   Taking  . sertraline (ZOLOFT) 100 MG tablet Take 100 mg by mouth every morning.   Taking  . [DISCONTINUED] ibuprofen (ADVIL,MOTRIN) 800 MG tablet Take 1 tablet (800 mg total) by mouth every 8 (eight) hours as needed for moderate pain. 45 tablet 1 Taking    Review of Systems  Constitutional: Negative for fever.  Gastrointestinal: Positive for nausea. Negative for abdominal pain, constipation, diarrhea and vomiting.  Genitourinary: Positive for pelvic pain and vaginal bleeding. Negative for dysuria, frequency and hematuria.  Musculoskeletal: Positive for back pain.   Physical Exam   Blood pressure 126/70, pulse 74, temperature 97.9 F (36.6 C), temperature source Oral, resp. rate 16, height 5\' 8"  (1.727 m), weight 76.2 kg, last menstrual period 12/10/2017, unknown if currently breastfeeding.  Physical Exam  Constitutional: She is oriented to person, place, and time. She appears well-developed and well-nourished. No distress.  HENT:  Head: Normocephalic and atraumatic.  Neck: Normal range of motion.  Cardiovascular: Normal rate.  Respiratory: Effort normal. No respiratory distress.  GI: Soft. She exhibits no distension and no mass. There is no abdominal tenderness. There is no rebound and no guarding.  Genitourinary:    Genitourinary Comments: External: no lesions or erythema Vagina: rugated, pink, moist, small bloody discharge Uterus: non enlarged, anteverted, non tender,  no CMT Adnexae: no masses, + tenderness left, + tenderness right Cervix nml    Musculoskeletal: Normal range of motion.  Neurological: She is alert and oriented to person, place, and time.  Skin: Skin is warm and dry.  Psychiatric: She has a normal mood and affect.   Results for orders placed or performed during the hospital encounter of 01/24/18 (from the past 24 hour(s))  Urinalysis, Routine w reflex microscopic     Status: Abnormal   Collection Time: 01/24/18 11:17 AM  Result Value Ref Range   Color, Urine YELLOW YELLOW   APPearance CLEAR CLEAR   Specific Gravity, Urine 1.020 1.005 - 1.030   pH 7.0 5.0 - 8.0   Glucose, UA NEGATIVE NEGATIVE mg/dL   Hgb urine dipstick LARGE (A) NEGATIVE   Bilirubin Urine NEGATIVE NEGATIVE   Ketones, ur NEGATIVE NEGATIVE mg/dL   Protein, ur NEGATIVE NEGATIVE mg/dL   Nitrite NEGATIVE NEGATIVE   Leukocytes, UA NEGATIVE NEGATIVE   RBC / HPF 21-50 0 - 5 RBC/hpf   WBC, UA 0-5 0 - 5 WBC/hpf   Bacteria, UA NONE SEEN NONE SEEN   Mucus PRESENT   Wet prep, genital     Status: Abnormal   Collection Time: 01/24/18 11:35 AM  Result Value Ref Range   Yeast Wet Prep HPF POC NONE SEEN NONE SEEN   Trich, Wet Prep NONE SEEN NONE SEEN   Clue Cells Wet Prep HPF POC NONE SEEN NONE SEEN   WBC, Wet Prep HPF POC FEW (A) NONE SEEN   Sperm NONE SEEN   CBC     Status: None   Collection Time: 01/24/18 11:56 AM  Result Value Ref Range   WBC 7.8 4.0 - 10.5 K/uL   RBC 4.17 3.87 - 5.11 MIL/uL   Hemoglobin 12.9 12.0 - 15.0 g/dL   HCT 40.9 81.1 - 91.4 %   MCV 95.0 80.0 - 100.0 fL   MCH 30.9 26.0 - 34.0 pg   MCHC 32.6 30.0 - 36.0 g/dL   RDW 78.2 95.6 - 21.3 %   Platelets 353 150 - 400 K/uL   nRBC 0.0 0.0 - 0.2 %  Pregnancy, urine POC     Status: None   Collection Time: 01/24/18 12:12 PM  Result Value Ref Range   Preg Test, Ur NEGATIVE NEGATIVE   US Pelvic Complete W Transvaginal And Torsion R/o  Result Date: 01/24/2018 CLINICAL DATA:  History of pelvic pain.  EXAM: TRANSABDOMINAL AND TRANSVAGINAL ULTRASOUND OF PELVIS TECHNIQUE: Both transabdominal and transvaginal ultrasound examinations of the pelvis were performed. Transabdominal technique was performed for global imaging of the pelvis including uterus, ovaries, adnexal regions, and pelvic cul-de-sac. It was necessary to proceed with endovaginal exam following the transabdominal exam to visualize the adnexal structures. COMPARISON:  Pelvic ultrasound 02/06/2017 FINDINGS: Uterus Measurements: 8.9 x 5.2 x 6.2 cm = volume: 150.6 mL. No fibroids or other mass visualized. Endometrium Thickness: 6.2.  No focal abnormality visualized. Right ovary Measurements: 3.2  x 2.1 x 3.0 cm = volume: 10.7 mL. Normal appearance/no adnexal mass. Left ovary Surgically absent Other findings No abnormal free fluid. IMPRESSION: No acute process within the pelvis.  Surgically absent left ovary. Electronically Signed   By: Annia Beltrew  Davis M.D.   On: 01/24/2018 13:44   MAU Course  Procedures Toradol  MDM Labs and US ordered and reviewed. No evidence of acute abdominal or pelvic process. Sx likely dysmenorrhea. Pain improved with Toradol. Stable for discharge home.   Assessment and Plan   1. Dysmenorrhea   2. Pelvic pain    Discharge home Follow up with Dr. Mindi SlickerBanga in 1-2 weeks if sx persist Rx Ibuprofen  Allergies as of 01/24/2018      Reactions   Beef-derived Products Nausea And Vomiting   "EXTREMELY SICK"   Penicillins Rash      Medication List    STOP taking these medications   oxyCODONE-acetaminophen 5-325 MG tablet Commonly known as:  ROXICET     TAKE these medications   cetirizine 10 MG tablet Commonly known as:  ZYRTEC Take 10 mg by mouth daily as needed.   ibuprofen 800 MG tablet Commonly known as:  ADVIL,MOTRIN Take 1 tablet (800 mg total) by mouth every 8 (eight) hours as needed for moderate pain.   rizatriptan 10 MG tablet Commonly known as:  MAXALT Take 10 mg by mouth as needed for migraine. May  repeat in 2 hours if needed   sertraline 100 MG tablet Commonly known as:  ZOLOFT Take 100 mg by mouth every morning.      Donette LarryMelanie Vendela Troung, CNM 01/24/2018, 1:56 PM

## 2018-01-24 NOTE — Discharge Instructions (Signed)
Dysmenorrhea  Dysmenorrhea means painful cramps during your period (menstrual period). You will have pain in your lower belly (abdomen). The pain is caused by the tightening (contracting) of the muscles of the womb (uterus). The pain may be mild or very bad. With this condition, you may:  Have a headache.  Feel sick to your stomach (nauseous).  Throw up (vomit).  Have lower back pain.  Follow these instructions at home:  Helping pain and cramping    Put heat on your lower back or belly when you have pain or cramps. Use the heat source that your doctor tells you to use.  Place a towel between your skin and the heat.  Leave the heat on for 20-30 minutes.  Remove the heat if your skin turns bright red. This is especially important if you cannot feel pain, heat, or cold.  Do not have a heating pad on during sleep.  Do aerobic exercises. These include walking, swimming, or biking. These may help with cramps.  Massage your lower back or belly. This may help lessen pain.  General instructions  Take over-the-counter and prescription medicines only as told by your doctor.  Do not drive or use heavy machinery while taking prescription pain medicine.  Avoid alcohol and caffeine during and right before your period. These can make cramps worse.  Do not use any products that have nicotine or tobacco. These include cigarettes and e-cigarettes. If you need help quitting, ask your doctor.  Keep all follow-up visits as told by your doctor. This is important.  Contact a doctor if:  You have pain that gets worse.  You have pain that does not get better with medicine.  You have pain during sex.  You feel sick to your stomach or you throw up during your period, and medicine does not help.  Get help right away if:  You pass out (faint).  Summary  Dysmenorrhea means painful cramps during your period (menstrual period).  Put heat on your lower back or belly when you have pain or cramps.  Do exercises like walking, swimming, or biking to  help with cramps.  Contact a doctor if you have pain during sex.  This information is not intended to replace advice given to you by your health care provider. Make sure you discuss any questions you have with your health care provider.  Document Released: 04/21/2008 Document Revised: 02/10/2016 Document Reviewed: 02/10/2016  Elsevier Interactive Patient Education  2019 [employer]

## 2018-01-25 LAB — GC/CHLAMYDIA PROBE AMP (~~LOC~~) NOT AT ARMC
CHLAMYDIA, DNA PROBE: NEGATIVE
Neisseria Gonorrhea: NEGATIVE

## 2018-03-25 ENCOUNTER — Other Ambulatory Visit: Payer: Self-pay

## 2018-03-25 ENCOUNTER — Inpatient Hospital Stay (HOSPITAL_COMMUNITY): Payer: PRIVATE HEALTH INSURANCE

## 2018-03-25 ENCOUNTER — Inpatient Hospital Stay (HOSPITAL_COMMUNITY)
Admission: AD | Admit: 2018-03-25 | Discharge: 2018-03-25 | Disposition: A | Discharge status: Home / Self Care | Payer: PRIVATE HEALTH INSURANCE | Attending: Obstetrics and Gynecology | Admitting: Obstetrics and Gynecology

## 2018-03-25 ENCOUNTER — Encounter (HOSPITAL_COMMUNITY): Payer: Self-pay | Admitting: *Deleted

## 2018-03-25 DIAGNOSIS — Z880 Allergy status to penicillin: Secondary | ICD-10-CM | POA: Insufficient documentation

## 2018-03-25 DIAGNOSIS — E860 Dehydration: Secondary | ICD-10-CM | POA: Diagnosis not present

## 2018-03-25 DIAGNOSIS — Z3202 Encounter for pregnancy test, result negative: Secondary | ICD-10-CM | POA: Diagnosis not present

## 2018-03-25 DIAGNOSIS — R1031 Right lower quadrant pain: Secondary | ICD-10-CM | POA: Insufficient documentation

## 2018-03-25 DIAGNOSIS — R1030 Lower abdominal pain, unspecified: Secondary | ICD-10-CM | POA: Diagnosis not present

## 2018-03-25 LAB — URINALYSIS, ROUTINE W REFLEX MICROSCOPIC
Bilirubin Urine: NEGATIVE
Glucose, UA: NEGATIVE mg/dL
Hgb urine dipstick: NEGATIVE
Ketones, ur: 15 mg/dL — AB
Leukocytes,Ua: NEGATIVE
Nitrite: NEGATIVE
Protein, ur: NEGATIVE mg/dL
Specific Gravity, Urine: >1.03 — ABNORMAL HIGH (ref 1.005–1.030)
pH: 5.5 (ref 5.0–8.0)

## 2018-03-25 LAB — POCT PREGNANCY, URINE: Preg Test, Ur: NEGATIVE

## 2018-03-25 MED ORDER — KETOROLAC TROMETHAMINE 60 MG/2ML IM SOLN
60.0000 mg | Freq: Once | INTRAMUSCULAR | Status: AC
  Administered 2018-03-25: 60 mg via INTRAMUSCULAR
  Filled 2018-03-25: qty 2

## 2018-03-25 NOTE — MAU Note (Signed)
Having a lot of pain in low back and lower abd.  Called office.  Has hx of endometriosis, taking rx for that.  Only has one over.

## 2018-03-25 NOTE — Discharge Instructions (Signed)
Contact the office if you continue to have problems. Your ultrasound tonight is normal.  There is no torsion and no cysts.   This Saturday, 03-31-18, the [facility] will be moving to the [facility] campus. At that time, the MAU (Maternity Admissions Unit), where you are being seen today, will no longer take care of non-pregnant patients. We strongly encourage you to find a doctor's office before that time, so that you can be seen with any GYN concerns, like vaginal discharge, urinary tract infection, etc.. in a timely manner.  For urgent needs, [facility] is also available for management of urgent GYN complaints such as vaginal discharge or urinary tract infections.

## 2018-03-25 NOTE — MAU Provider Note (Signed)
History     CSN: [id number]  Arrival date and time: 03/25/18 8828   First Provider Initiated Contact with Patient 03/25/18 2009      Chief Complaint  Patient presents with  . Abdominal Pain   HPI Regina Scott 38 y.o. Comes to MAU today with constant lower abdominal pain all across the lower abdomen and RLQ pain that sometimes is worse.  Last night she bent forward in a sitting position, she had a stabbing pain that was severe for a couple of minutes.  Then the severity lessened but the pain has persisted.  Stated the pain reminds her of the pain she had when her left ovary had torsed and she had to have surgery to remove it.Her last pain medication was Ibuprofen 800 mg at 2 pm today.  It has not relieved her pain and cramping.  Currently is on [place] for endometriosis.  Talked with her doctor today who said she should come in for evaluation and then follow up in the office as needed based on the findings in MAU. Client of [facility].   OB History    Gravida  4   Para  2   Term  2   Preterm      AB  2   Living  2     SAB  1   TAB  1   Ectopic      Multiple      Live Births  2           Past Medical History:  Diagnosis Date  . Constipation   . 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 Heart murmur    per pt was told during pregnancy's was told doctor hears at murmur , no work-up done and asymptomatic  . Ovarian cyst, complex 02/09/2017  . Pelvic pain 02/09/2017  . Wears glasses     Past Surgical History:  Procedure Laterality Date  . DILATION AND CURETTAGE OF UTERUS  2013  approx.   w/  suction for missed ab  . LAPAROSCOPIC SALPINGO OOPHERECTOMY Left 02/13/2017   Procedure: operative laparoscopy, left salpingo oophorectomy, lysis of adhesions, fulgeration of endometriosis;  Surgeon: Sherian Rein, MD;  [facility]: [facility];  Service: Gynecology;  Laterality: Left;  MD request RNFA    Family History  Problem Relation Age of Onset   . Cancer Mother        breast  . COPD Father   . Diabetes Maternal Grandmother   . Heart attack Maternal Grandmother     Social History   Tobacco Use  . Smoking status: Never Smoker  . Smokeless tobacco: Never Used  Substance Use Topics  . Alcohol use: No  . Drug use: No    Allergies:  Allergies  Allergen Reactions  . Beef-Derived Products Nausea And Vomiting    "EXTREMELY SICK"  . Penicillins Rash    Medications Prior to Admission  Medication Sig Dispense Refill Last Dose  . cetirizine (ZYRTEC) 10 MG tablet Take 10 mg by mouth daily as needed.    Not Taking  . ibuprofen (ADVIL,MOTRIN) 800 MG tablet Take 1 tablet (800 mg total) by mouth every 8 (eight) hours as needed for moderate pain. 20 tablet 0   . rizatriptan (MAXALT) 10 MG tablet Take 10 mg by mouth as needed for migraine. May repeat in 2 hours if needed   Taking  . sertraline (ZOLOFT) 100 MG tablet Take 100 mg by mouth every morning.   Taking  Review of Systems  Constitutional: Negative for fever.  Gastrointestinal: Positive for abdominal pain and nausea. Negative for vomiting.  Genitourinary: Negative for vaginal bleeding and vaginal discharge.   Physical Exam   Blood pressure 122/75, pulse 71, temperature 98.2 F (36.8 C), temperature source Oral, resp. rate 16, weight 80.6 kg, last menstrual period 02/09/2018, SpO2 98 %, unknown if currently breastfeeding.  Physical Exam  Nursing note and vitals reviewed. Constitutional: She is oriented to person, place, and time. She appears well-developed and well-nourished.  HENT:  Head: Normocephalic.  Eyes: EOM are normal.  Neck: Neck supple.  Cardiovascular: Normal rate and regular rhythm.  Respiratory: Effort normal.  GI: Soft. There is abdominal tenderness. There is no rebound and no guarding.  Tender all across the lower abdomen with more point tenderness in the RLQ.  Musculoskeletal: Normal range of motion.  Neurological: She is alert and oriented to  person, place, and time.  Skin: Skin is warm and dry.  Psychiatric: She has a normal mood and affect.    MAU Course  Procedures Results for orders placed or performed during the hospital encounter of 03/25/18 (from the past 24 hour(s))  Urinalysis, Routine w reflex microscopic     Status: Abnormal   Collection Time: 03/25/18  6:48 PM  Result Value Ref Range   Color, Urine YELLOW YELLOW   APPearance CLEAR CLEAR   Specific Gravity, Urine >1.030 (H) 1.005 - 1.030   pH 5.5 5.0 - 8.0   Glucose, UA NEGATIVE NEGATIVE mg/dL   Hgb urine dipstick NEGATIVE NEGATIVE   Bilirubin Urine NEGATIVE NEGATIVE   Ketones, ur 15 (A) NEGATIVE mg/dL   Protein, ur NEGATIVE NEGATIVE mg/dL   Nitrite NEGATIVE NEGATIVE   Leukocytes,Ua NEGATIVE NEGATIVE  Pregnancy, urine POC     Status: None   Collection Time: 03/25/18  6:51 PM  Result Value Ref Range   Preg Test, Ur NEGATIVE NEGATIVE   CLINICAL DATA:  Initial evaluation for acute pelvic pain. History of prior left salpingo-oophorectomy for torsion. Endometriosis.  EXAM: ULTRASOUND PELVIS TRANSVAGINAL  TECHNIQUE: Transvaginal ultrasound examination of the pelvis was performed including evaluation of the uterus, ovaries, adnexal regions, and pelvic cul-de-sac.  COMPARISON:  None available.  FINDINGS: Uterus  Measurements: 9.0 x 5.2 x 6.2 cm = volume: 150.6 mL. No fibroids or other mass visualized.  Endometrium  Thickness: 6.2 mm.  No focal abnormality visualized.  Right ovary  Measurements: 3.2 x 2.1 x 3.0 cm = volume: 10.7 mL. Normal appearance/no adnexal mass.  Left ovary  Surgically absent and not visualized.  No adnexal mass.  Other findings:  No abnormal free fluid  IMPRESSION: 1. No acute abnormality within the pelvis. 2. Normal sonographic appearance of the uterus, endometrium, and right ovary. 3. Prior left oophorectomy.  No other adnexal mass or free fluid.   MDM Will give Toradol IM for pain - it did help  in her last MAU visit.  And in anticipation of [place] tonight, she may need medication so the procedure can be accomplished. Toradol relieved her pain.  In no pain at the end of her visit.  Assessment and Plan  Abdominal pain Slight dehydration  Plan Your ultrasound is normal tonight and no cyst or torsion is identified. Drink at least 8 8-oz glasses of water every day. Continue ibuprofen by the package directions - prescription that you have. Follow up with the office for further evaluation.  Terri L Burleson 03/25/2018, 8:18 PM

## 2019-05-09 IMAGING — US US PELV - US TRANSVAGINAL
1 series · 15 of 25 positions shown · non-contrast
Comparison: None.

CLINICAL DATA: Initial evaluation for acute pain, vaginal bleeding.
History of left ovarian cyst.

EXAM:
TRANSABDOMINAL AND TRANSVAGINAL ULTRASOUND OF PELVIS
DOPPLER ULTRASOUND OF OVARIES
TECHNIQUE: Both transabdominal and transvaginal ultrasound examinations of the
pelvis were performed. Transabdominal technique was performed for
global imaging of the pelvis including uterus, ovaries, adnexal
regions, and pelvic cul-de-sac.
It was necessary to proceed with endovaginal exam following the
transabdominal exam to visualize the uterus and ovaries. Color and
duplex Doppler ultrasound was utilized to evaluate blood flow to the
ovaries.

[Series 1: us pelv - us transvaginal · 15 of 74 slices shown]
[im 1/74]
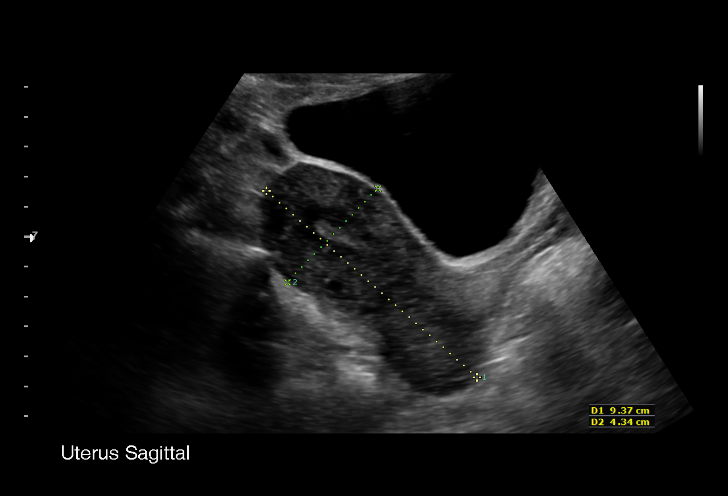
[im 7/74]
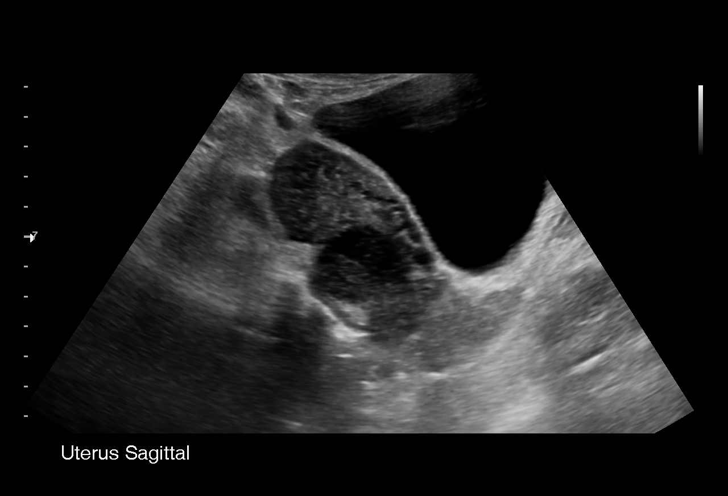
[im 13/74]
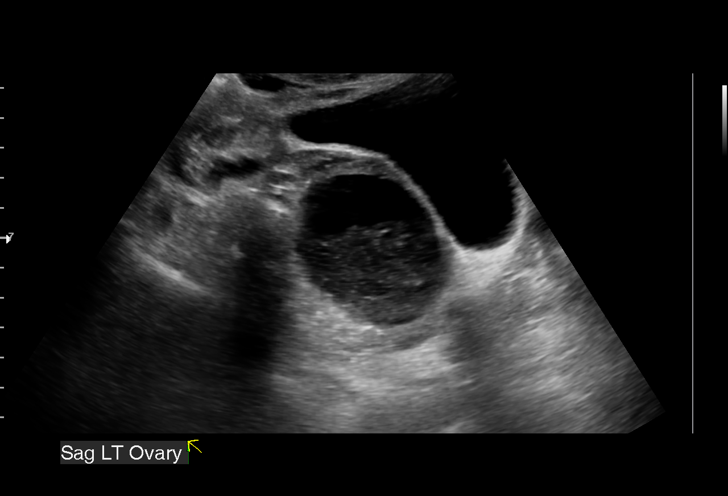
[im 16/74]
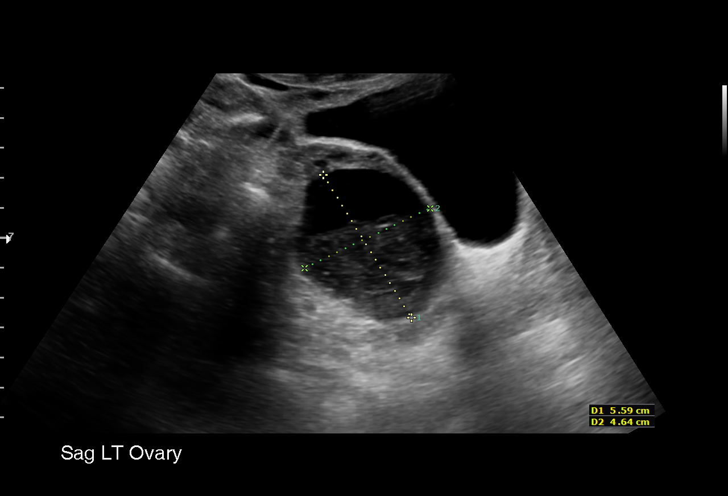
[im 22/74]
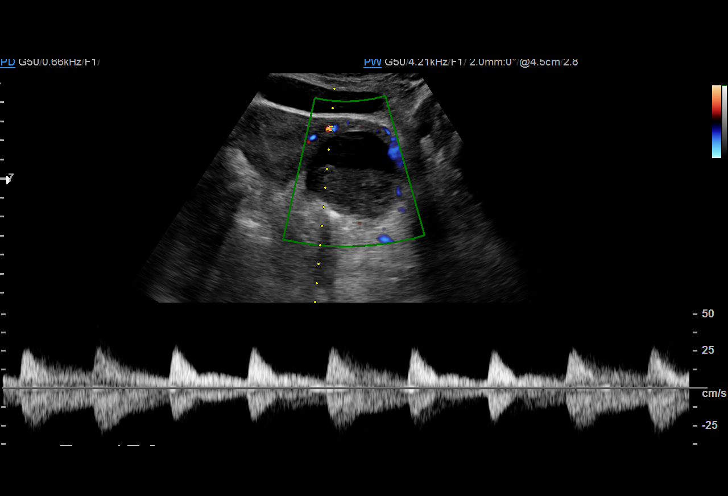
[im 28/74]
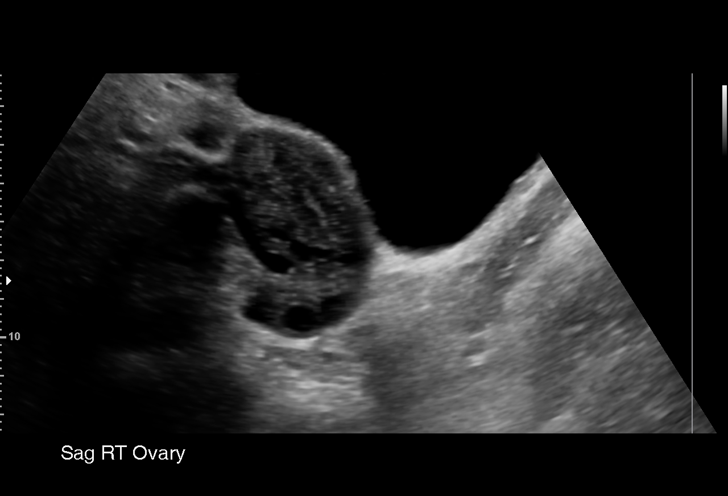
[im 31/74]
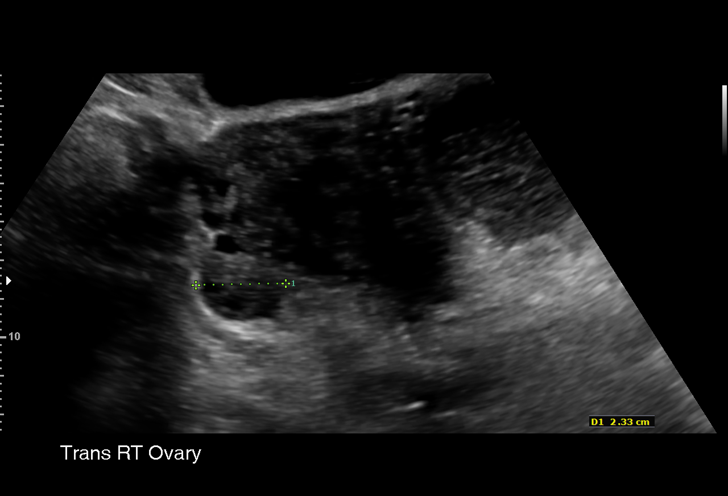
[im 37/74]
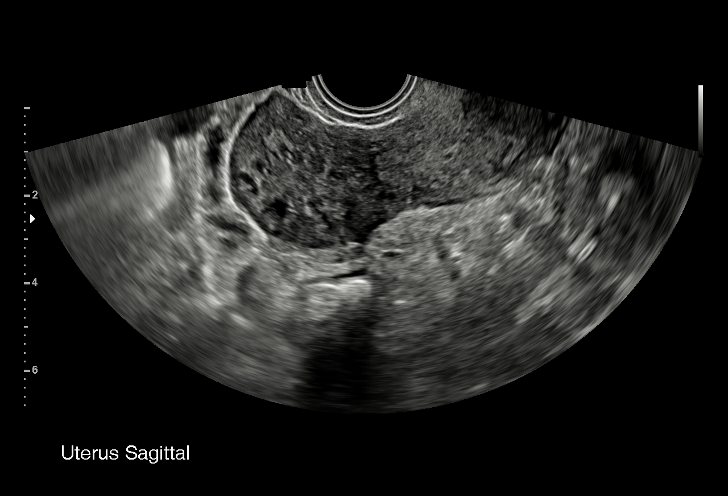
[im 43/74]
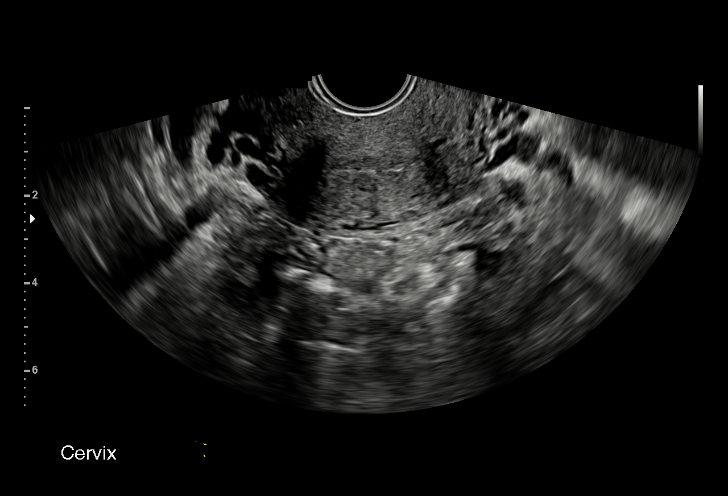
[im 46/74]
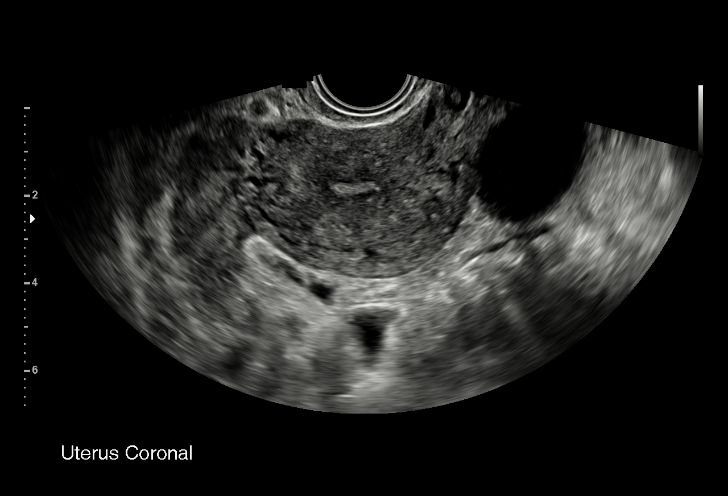
[im 52/74]
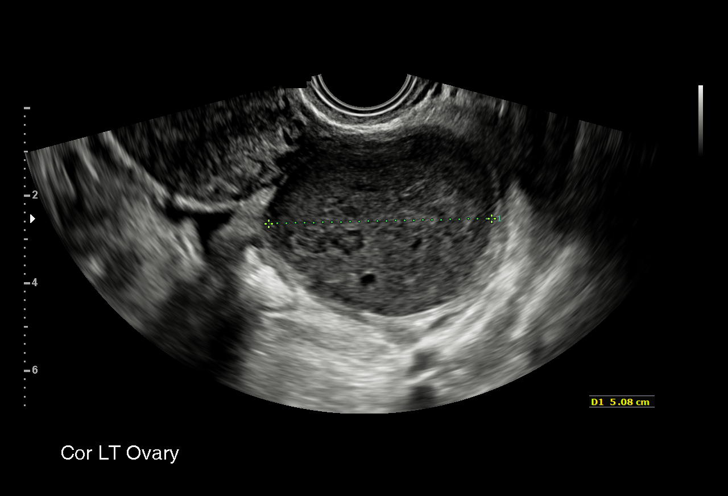
[im 58/74]
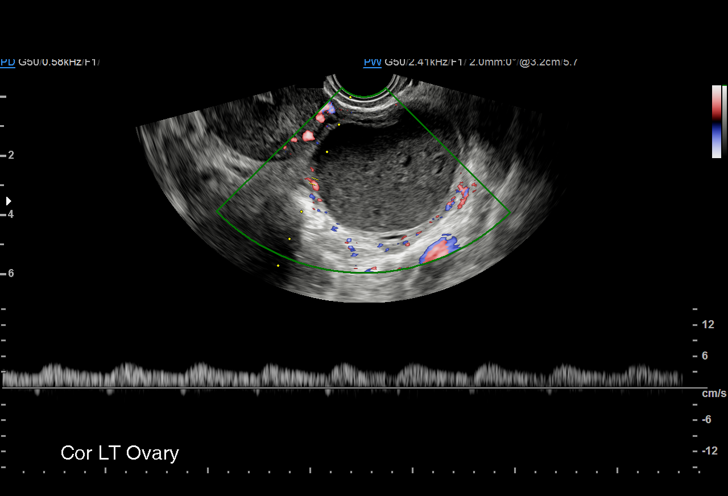
[im 61/74]
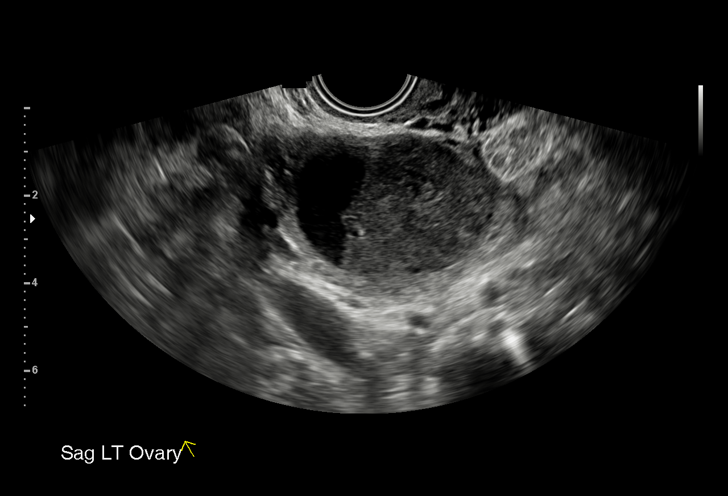
[im 67/74]
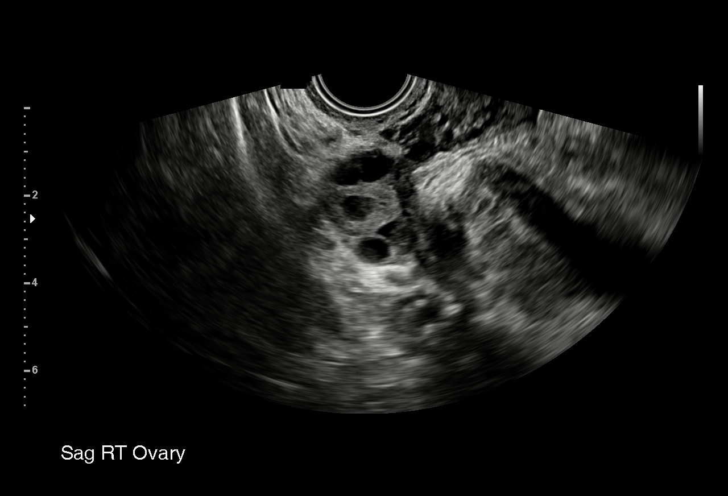
[im 74/74]
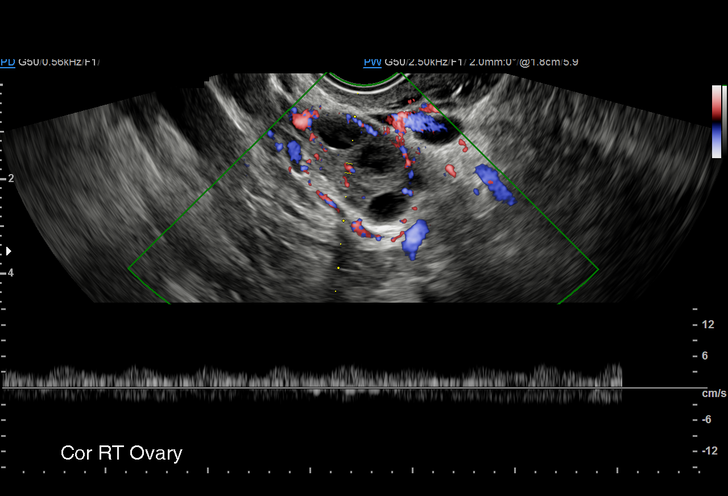

[15 of 25 positions shown; findings below may reference images not displayed]

FINDINGS: Uterus

Measurements: 8.7 x 3.7 x 5.2 cm. No fibroids or other mass
visualized.

Endometrium

Thickness: 4.2 mm.  No focal abnormality visualized.

Right ovary

Measurements: 3.3 x 2.4 x 2.4 cm. Normal appearance/no adnexal mass.

Left ovary

Measurements: 6.5 x 5.0 x 6.0 cm. 5.8 x 4.0 x 5.1 cm complex left
ovarian cyst. Lesion demonstrates eccentric echogenic material with
probable fluid fluid level. No internal vascularity or discrete
nodularity. Mildly increased peripheral vascularity about this
lesion.

Pulsed Doppler evaluation of both ovaries demonstrates normal
low-resistance arterial and venous waveforms.

Other findings

No abnormal free fluid.
IMPRESSION: 1. 5.8 x 4.0 x 5.1 cm complex left ovarian cyst. Imaging features
favor a hemorrhagic cyst with internal clot and/or fluid fluid
level. Given size, a short interval follow-up study in 6-12 weeks to
ensure resolution is recommended. This recommendation follows the
consensus statement: Management of Asymptomatic Ovarian and Other
Adnexal Cysts Imaged at US: Society of Radiologists in Ultrasound
2. No other acute abnormality within the pelvis. No evidence for
torsion.

## 2019-10-29 ENCOUNTER — Ambulatory Visit: Payer: PRIVATE HEALTH INSURANCE | Admitting: Gastroenterology

## 2019-11-24 ENCOUNTER — Encounter: Payer: Self-pay | Admitting: Obstetrics and Gynecology

## 2020-04-25 IMAGING — US US PELV - US TRANSVAGINAL
1 series · 15 of 25 positions shown · non-contrast
Comparison: Pelvic ultrasound 02/06/2017

CLINICAL DATA: History of pelvic pain.

EXAM:
TRANSABDOMINAL AND TRANSVAGINAL ULTRASOUND OF PELVIS
TECHNIQUE: Both transabdominal and transvaginal ultrasound examinations of the
pelvis were performed. Transabdominal technique was performed for
global imaging of the pelvis including uterus, ovaries, adnexal
regions, and pelvic cul-de-sac. It was necessary to proceed with
endovaginal exam following the transabdominal exam to visualize the
adnexal structures.

[Series 1: us pelv - us transvaginal · 15 of 61 slices shown]
[im 1/61]
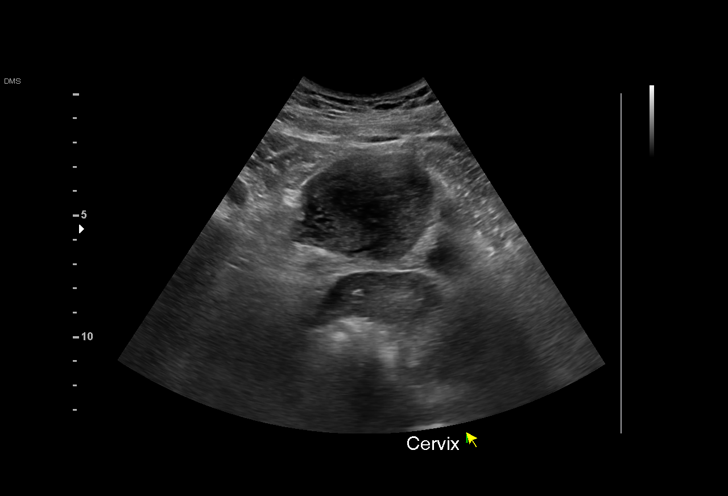
[im 6/61]
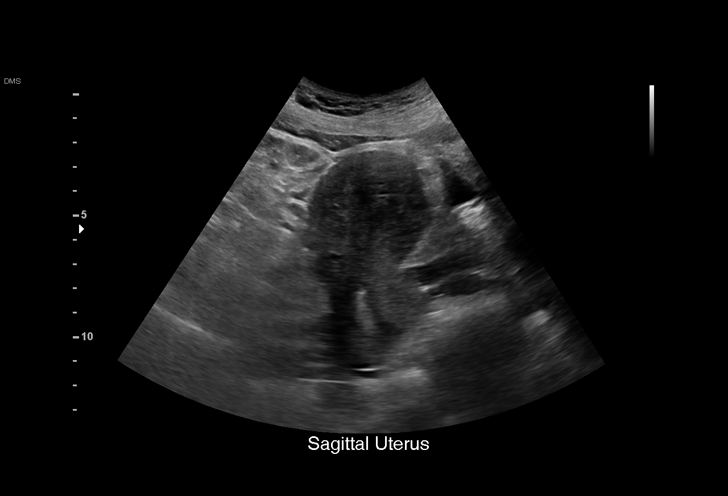
[im 11/61]
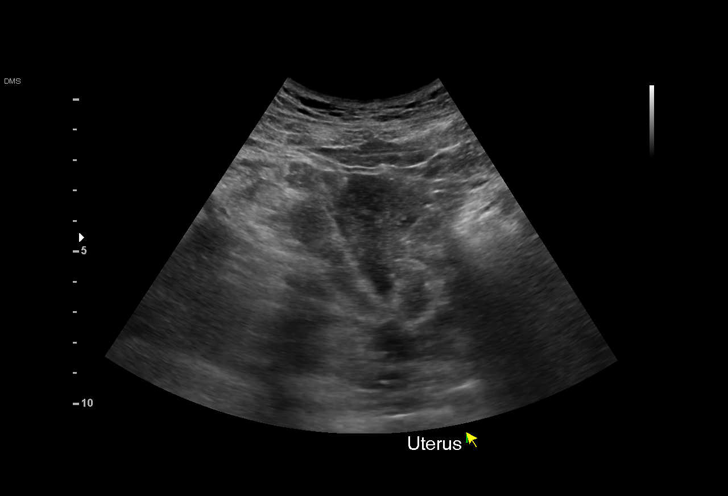
[im 13/61]
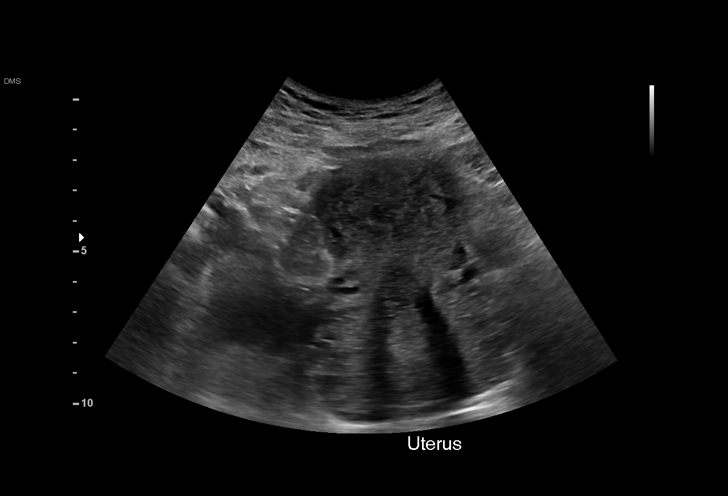
[im 18/61]
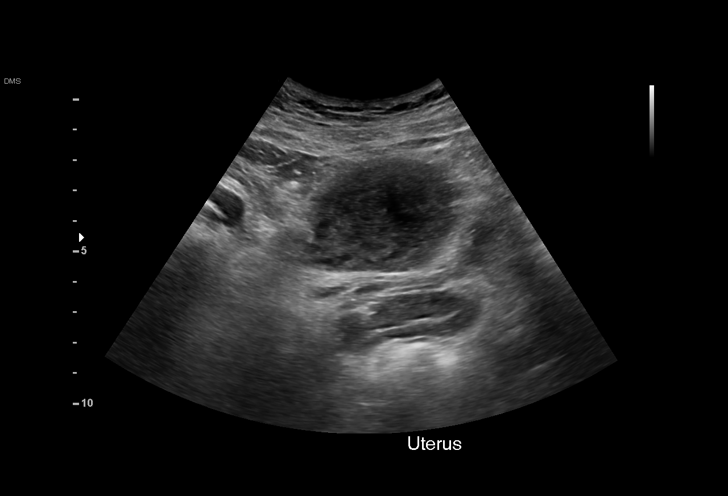
[im 23/61]
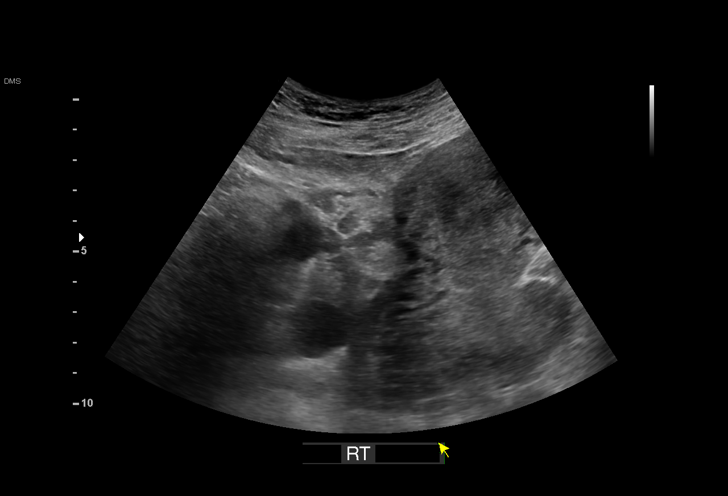
[im 26/61]
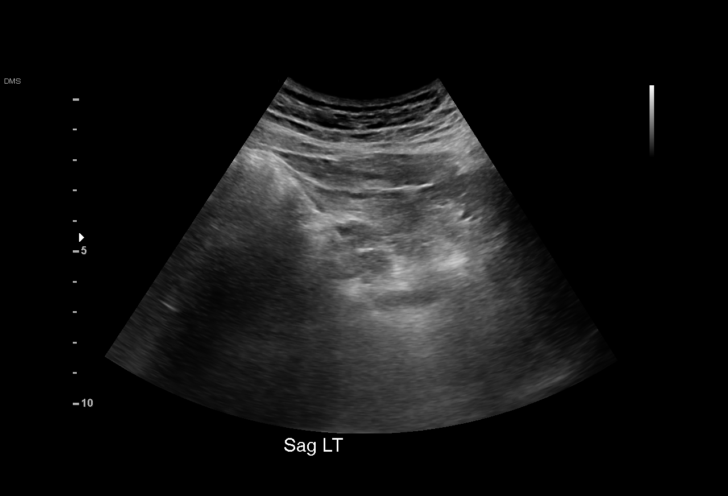
[im 31/61]
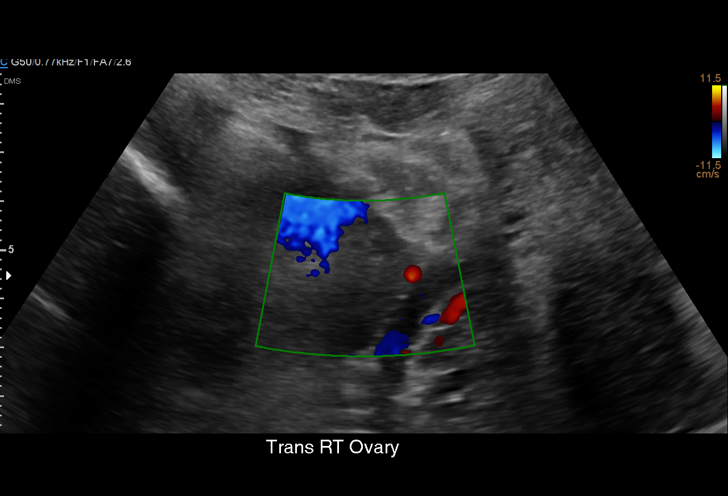
[im 36/61]
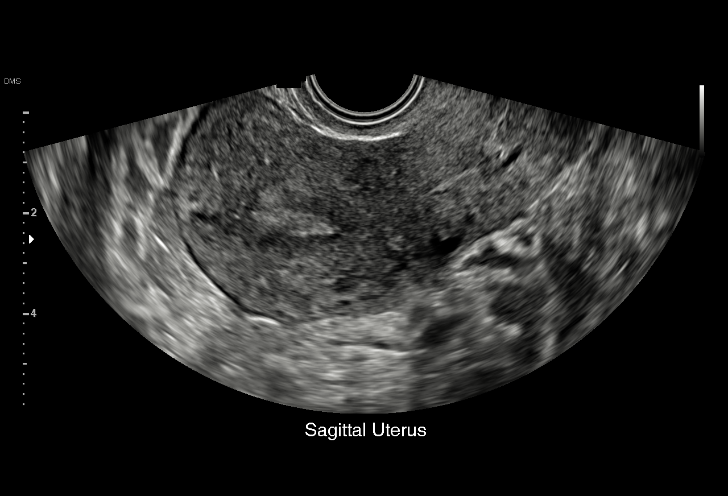
[im 38/61]
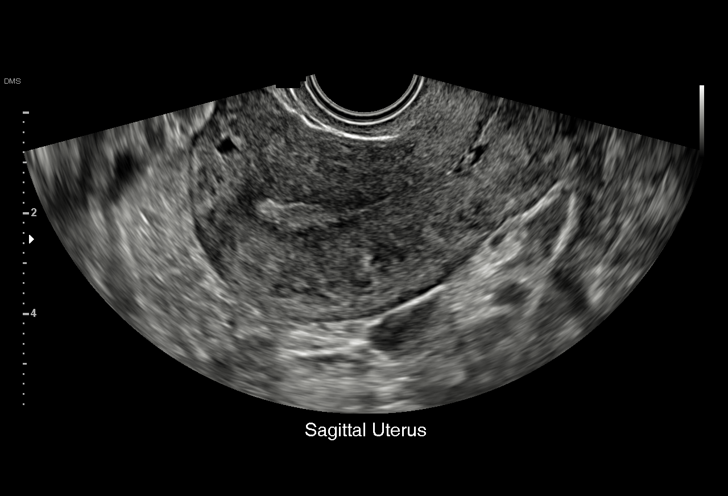
[im 43/61]
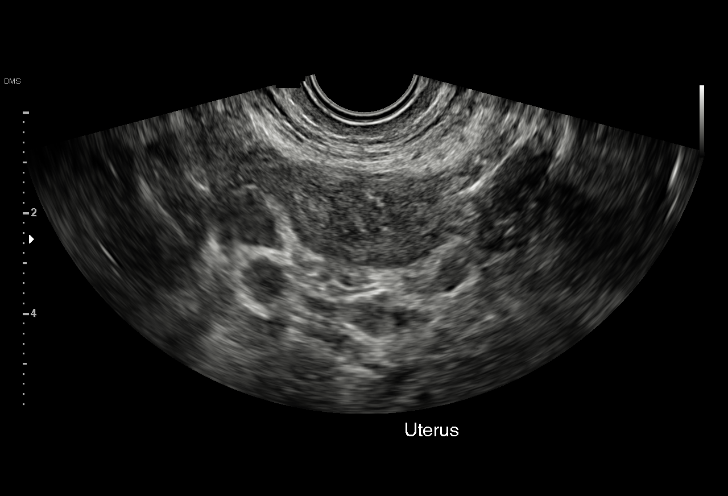
[im 48/61]
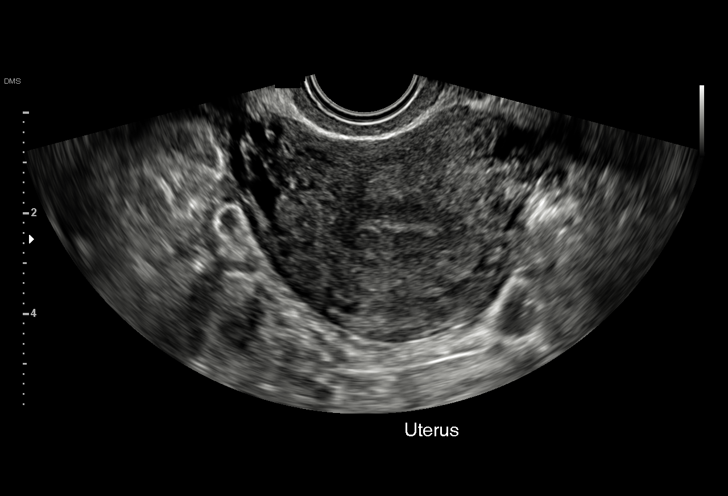
[im 51/61]
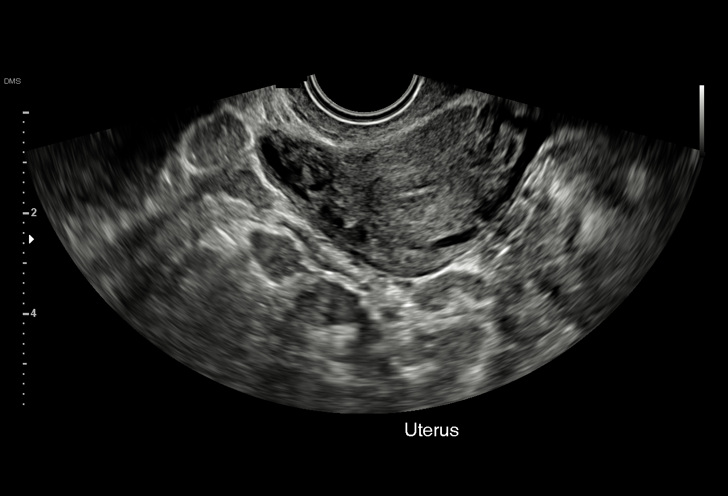
[im 56/61]
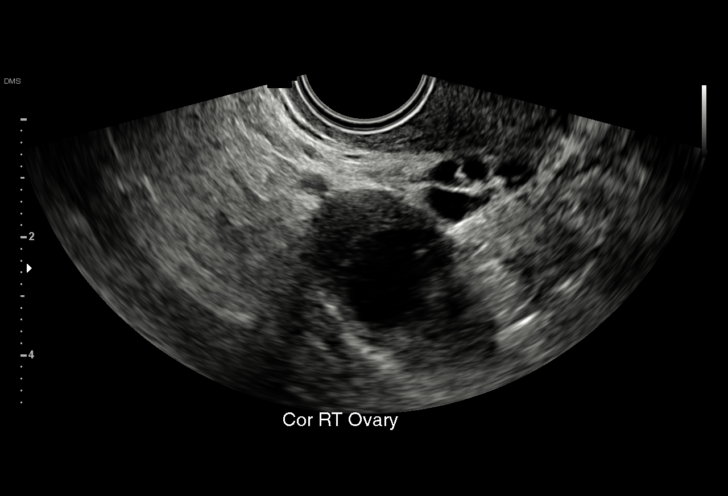
[im 61/61]
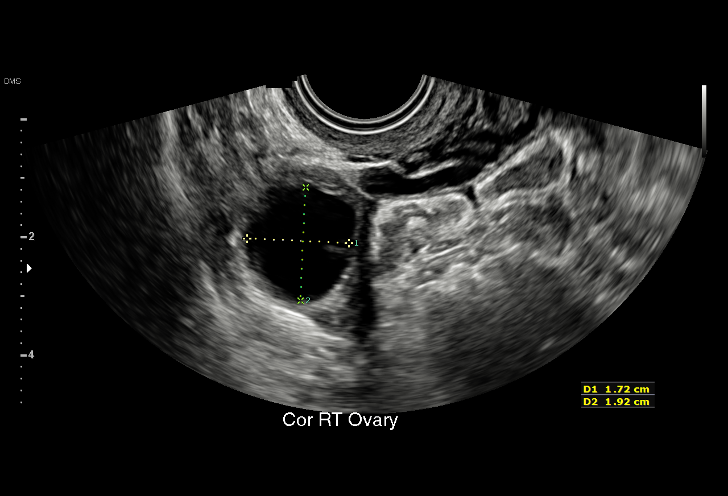

[15 of 25 positions shown; findings below may reference images not displayed]

FINDINGS: Uterus

Measurements: 8.9 x 5.2 x 6.2 cm = volume: 150.6 mL. No fibroids or
other mass visualized.

Endometrium

Thickness: 6.2.  No focal abnormality visualized.

Right ovary

Measurements: 3.2 x 2.1 x 3.0 cm = volume: 10.7 mL. Normal
appearance/no adnexal mass.

Left ovary

Surgically absent

Other findings

No abnormal free fluid.
IMPRESSION: No acute process within the pelvis.  Surgically absent left ovary.

## 2020-06-22 ENCOUNTER — Other Ambulatory Visit: Payer: Self-pay | Admitting: Pain Medicine

## 2020-06-22 DIAGNOSIS — M79604 Pain in right leg: Secondary | ICD-10-CM

## 2020-06-22 DIAGNOSIS — M545 Low back pain, unspecified: Secondary | ICD-10-CM

## 2020-06-24 IMAGING — US US TRANSVAGINAL NON-OB
1 series · 15 of 25 positions shown · non-contrast
Comparison: None available.

CLINICAL DATA: Initial evaluation for acute pelvic pain. History of
prior left salpingo-oophorectomy for torsion. Endometriosis.

EXAM:
ULTRASOUND PELVIS TRANSVAGINAL
TECHNIQUE: Transvaginal ultrasound examination of the pelvis was performed
including evaluation of the uterus, ovaries, adnexal regions, and
pelvic cul-de-sac.

[Series 1: us transvaginal non-ob · 15 of 36 slices shown]
[im 1/36]
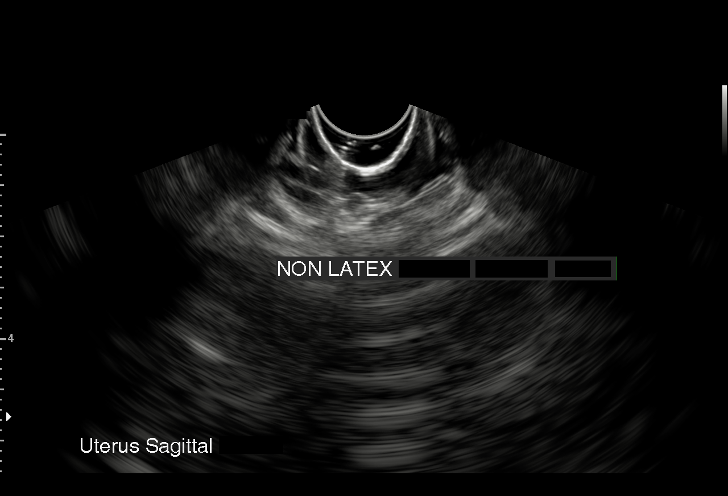
[im 3/36]
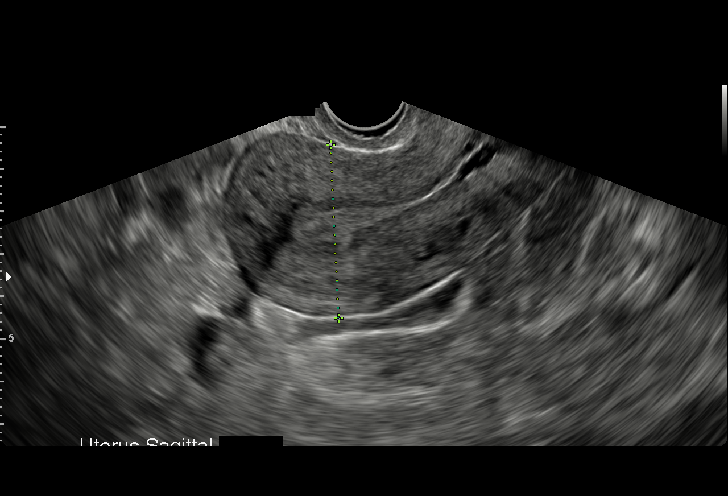
[im 6/36]
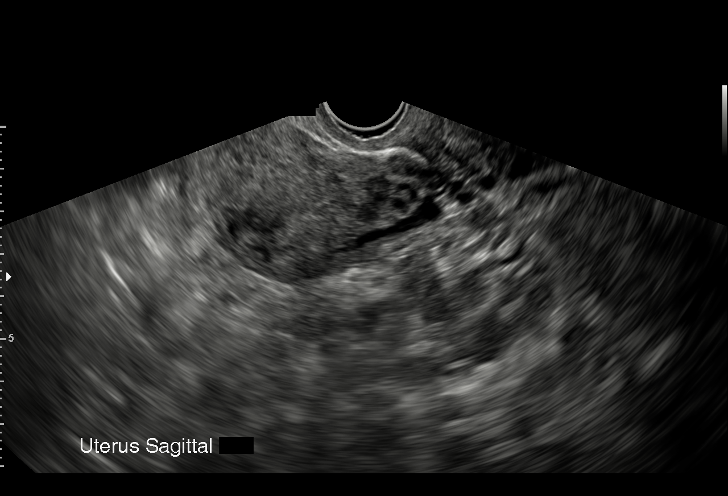
[im 8/36]
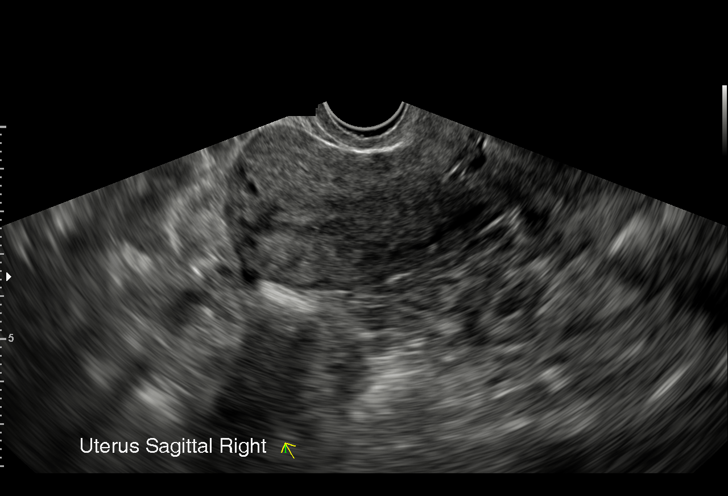
[im 11/36]
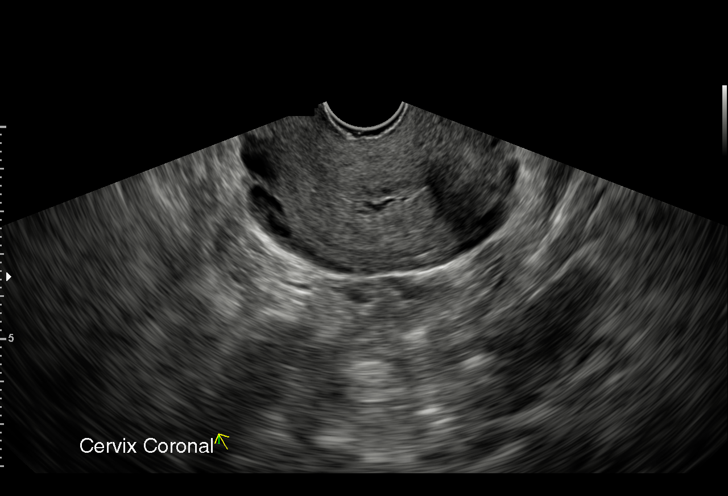
[im 14/36]
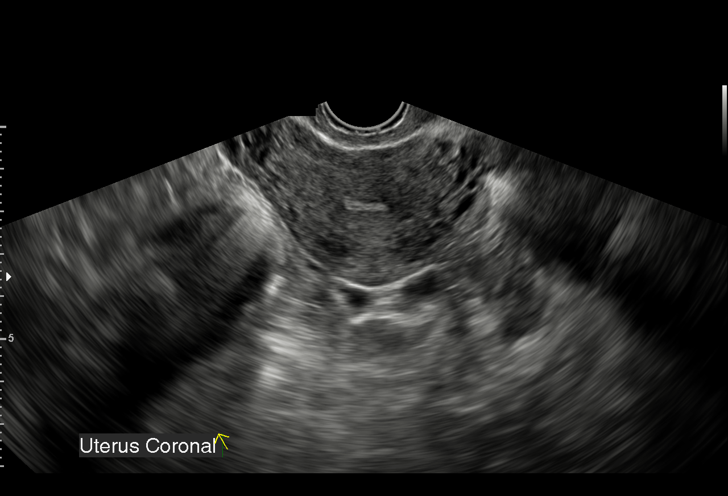
[im 15/36]
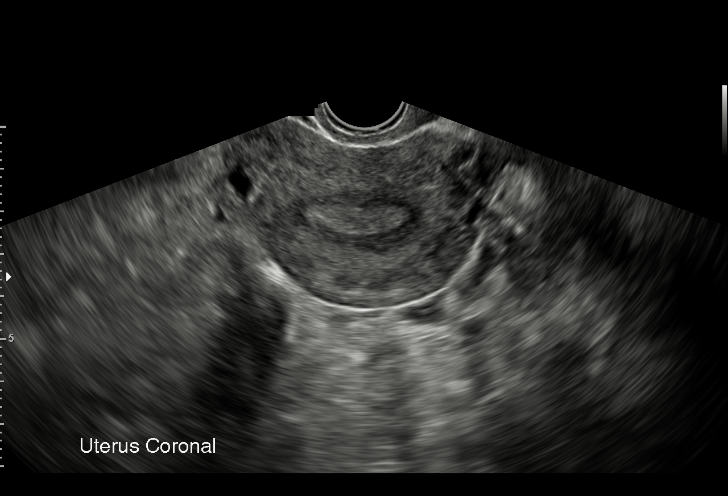
[im 18/36]
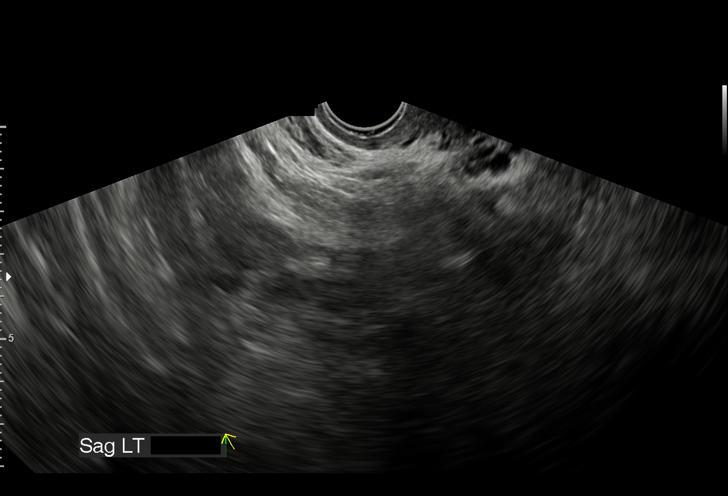
[im 21/36]
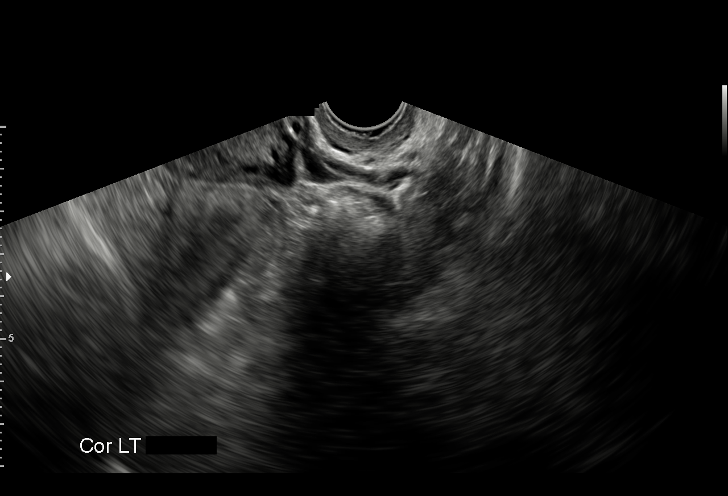
[im 22/36]
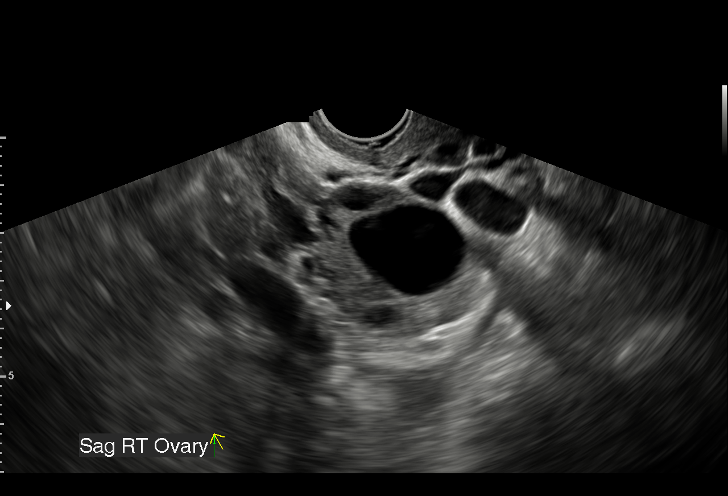
[im 25/36]
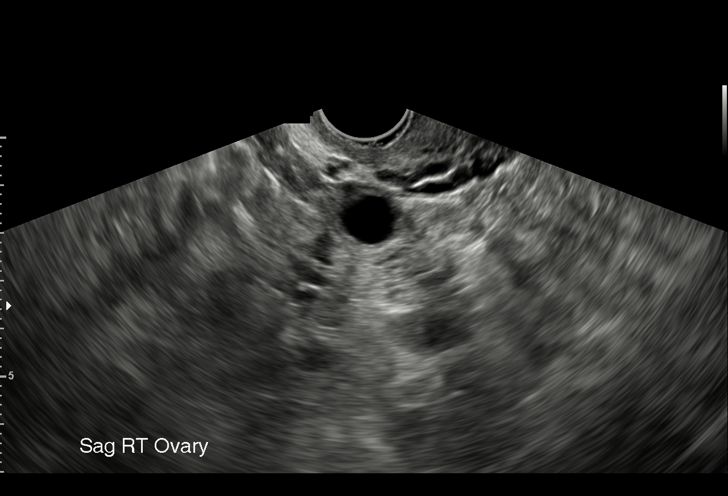
[im 28/36]
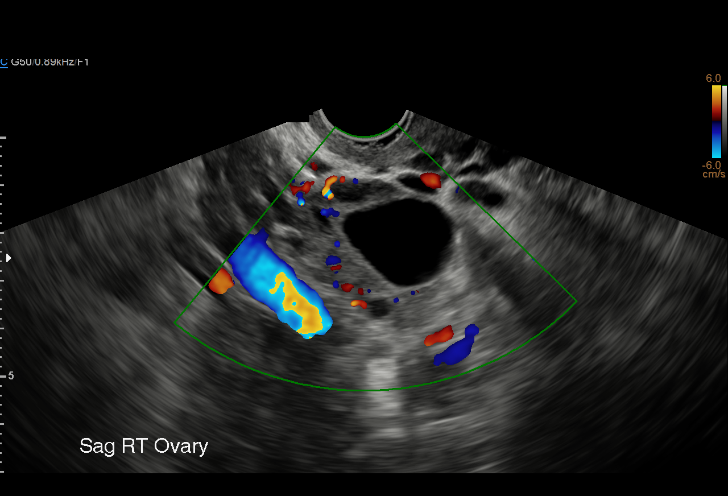
[im 30/36]
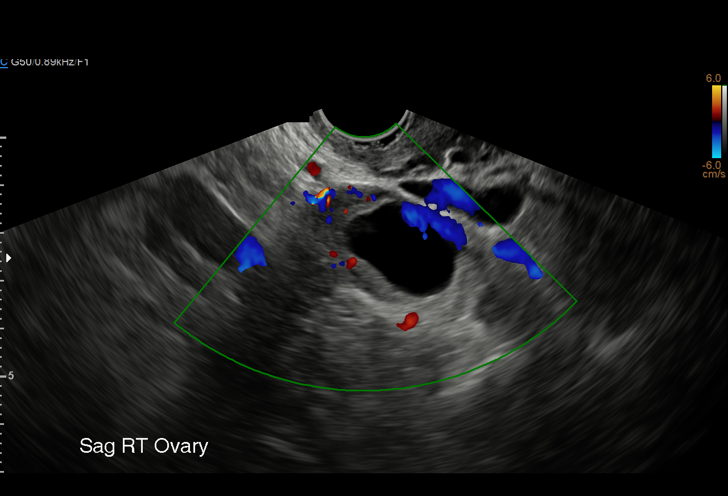
[im 33/36]
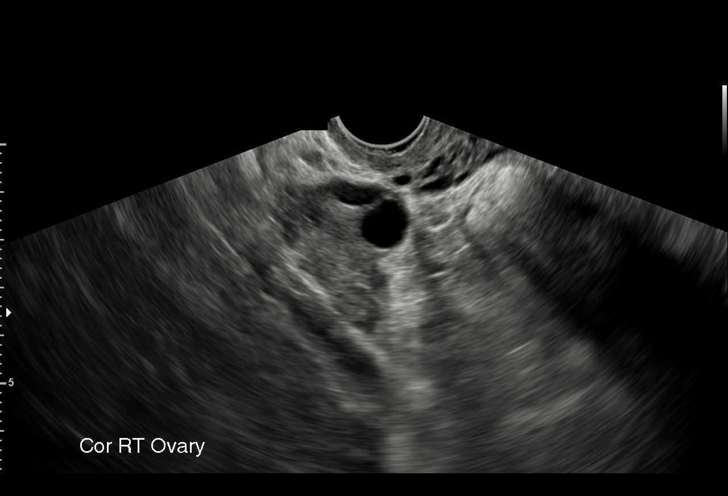
[im 36/36]
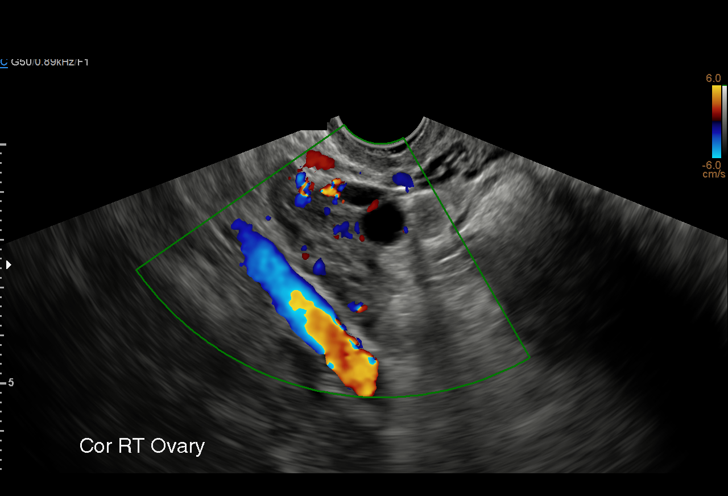

[15 of 25 positions shown; findings below may reference images not displayed]

FINDINGS: Uterus

Measurements: 9.0 x 5.2 x 6.2 cm = volume: 150.6 mL. No fibroids or
other mass visualized.

Endometrium

Thickness: 6.2 mm.  No focal abnormality visualized.

Right ovary

Measurements: 3.2 x 2.1 x 3.0 cm = volume: 10.7 mL. Normal
appearance/no adnexal mass.

Left ovary

Surgically absent and not visualized.  No adnexal mass.

Other findings:  No abnormal free fluid
IMPRESSION: 1. No acute abnormality within the pelvis.
2. Normal sonographic appearance of the uterus, endometrium, and
right ovary.
3. Prior left oophorectomy.  No other adnexal mass or free fluid.

## 2020-10-28 ENCOUNTER — Other Ambulatory Visit: Payer: Self-pay

## 2020-10-28 ENCOUNTER — Encounter (HOSPITAL_BASED_OUTPATIENT_CLINIC_OR_DEPARTMENT_OTHER): Payer: Self-pay | Admitting: Obstetrics and Gynecology

## 2020-10-28 NOTE — Progress Notes (Signed)
Spoke w/ via phone for pre-op interview--- Regina Scott needs dos----    UPT           Lab results------ COVID test -----patient states asymptomatic no test needed Arrive at ------- 0700 NPO after MN NO Solid Food.  Clear liquids from MN until--- Med rec completed Medications to take morning of surgery -----NONE Diabetic medication ----- Patient instructed no nail polish to be worn day of surgery Patient instructed to bring photo id and insurance card day of surgery Patient aware to have Driver (ride ) / caregiver    for 24 hours after surgery  Patient Special Instructions ----- Pre-Op special Istructions ----- Patient verbalized understanding of instructions that were given at this phone interview. Patient denies shortness of breath, chest pain, fever, cough at this phone interview.

## 2020-11-05 ENCOUNTER — Ambulatory Visit (HOSPITAL_BASED_OUTPATIENT_CLINIC_OR_DEPARTMENT_OTHER): Payer: BLUE CROSS/BLUE SHIELD | Admitting: Anesthesiology

## 2020-11-05 ENCOUNTER — Encounter (HOSPITAL_BASED_OUTPATIENT_CLINIC_OR_DEPARTMENT_OTHER): Payer: Self-pay | Admitting: Obstetrics and Gynecology

## 2020-11-05 ENCOUNTER — Ambulatory Visit (HOSPITAL_BASED_OUTPATIENT_CLINIC_OR_DEPARTMENT_OTHER)
Admission: RE | Admit: 2020-11-05 | Discharge: 2020-11-05 | Disposition: A | Discharge status: Home / Self Care | Payer: BLUE CROSS/BLUE SHIELD | Attending: Obstetrics and Gynecology | Admitting: Obstetrics and Gynecology

## 2020-11-05 ENCOUNTER — Encounter (HOSPITAL_BASED_OUTPATIENT_CLINIC_OR_DEPARTMENT_OTHER)
Admission: RE | Disposition: A | Discharge status: Home / Self Care | Payer: Self-pay | Source: Home / Self Care | Attending: Obstetrics and Gynecology

## 2020-11-05 DIAGNOSIS — N856 Intrauterine synechiae: Secondary | ICD-10-CM | POA: Insufficient documentation

## 2020-11-05 DIAGNOSIS — Z79899 Other long term (current) drug therapy: Secondary | ICD-10-CM | POA: Insufficient documentation

## 2020-11-05 DIAGNOSIS — Z8249 Family history of ischemic heart disease and other diseases of the circulatory system: Secondary | ICD-10-CM | POA: Diagnosis not present

## 2020-11-05 DIAGNOSIS — Z833 Family history of diabetes mellitus: Secondary | ICD-10-CM | POA: Diagnosis not present

## 2020-11-05 DIAGNOSIS — Z803 Family history of malignant neoplasm of breast: Secondary | ICD-10-CM | POA: Insufficient documentation

## 2020-11-05 DIAGNOSIS — Z880 Allergy status to penicillin: Secondary | ICD-10-CM | POA: Insufficient documentation

## 2020-11-05 DIAGNOSIS — N938 Other specified abnormal uterine and vaginal bleeding: Secondary | ICD-10-CM | POA: Insufficient documentation

## 2020-11-05 DIAGNOSIS — Z791 Long term (current) use of non-steroidal anti-inflammatories (NSAID): Secondary | ICD-10-CM | POA: Insufficient documentation

## 2020-11-05 DIAGNOSIS — N946 Dysmenorrhea, unspecified: Secondary | ICD-10-CM | POA: Diagnosis not present

## 2020-11-05 DIAGNOSIS — Z91014 Allergy to mammalian meats: Secondary | ICD-10-CM | POA: Insufficient documentation

## 2020-11-05 DIAGNOSIS — Z825 Family history of asthma and other chronic lower respiratory diseases: Secondary | ICD-10-CM | POA: Diagnosis not present

## 2020-11-05 HISTORY — PX: DILATATION & CURETTAGE/HYSTEROSCOPY WITH MYOSURE: SHX6511

## 2020-11-05 LAB — POCT PREGNANCY, URINE: Preg Test, Ur: NEGATIVE

## 2020-11-05 LAB — TYPE AND SCREEN
ABO/RH(D): A POS
Antibody Screen: NEGATIVE

## 2020-11-05 SURGERY — DILATATION & CURETTAGE/HYSTEROSCOPY WITH MYOSURE
Anesthesia: General | Site: Uterus

## 2020-11-05 MED ORDER — LACTATED RINGERS IV SOLN: INTRAVENOUS | Status: DC

## 2020-11-05 MED ORDER — LIDOCAINE 2% (20 MG/ML) 5 ML SYRINGE
INTRAMUSCULAR | Status: DC | PRN
  Administered 2020-11-05: 100 mg via INTRAVENOUS

## 2020-11-05 MED ORDER — LIDOCAINE HCL (PF) 2 % IJ SOLN
INTRAMUSCULAR | Status: AC
  Filled 2020-11-05: qty 5

## 2020-11-05 MED ORDER — POVIDONE-IODINE 10 % EX SWAB: 2.0000 "application " | Freq: Once | CUTANEOUS | Status: DC

## 2020-11-05 MED ORDER — FENTANYL CITRATE (PF) 100 MCG/2ML IJ SOLN
INTRAMUSCULAR | Status: AC
  Filled 2020-11-05: qty 2

## 2020-11-05 MED ORDER — ACETAMINOPHEN 500 MG PO TABS
1000.0000 mg | ORAL_TABLET | ORAL | Status: AC
  Administered 2020-11-05: 1000 mg via ORAL

## 2020-11-05 MED ORDER — KETOROLAC TROMETHAMINE 30 MG/ML IJ SOLN
INTRAMUSCULAR | Status: DC | PRN
  Administered 2020-11-05: 30 mg via INTRAVENOUS

## 2020-11-05 MED ORDER — PROMETHAZINE HCL 25 MG/ML IJ SOLN: 6.2500 mg | INTRAMUSCULAR | Status: DC | PRN

## 2020-11-05 MED ORDER — FENTANYL CITRATE (PF) 100 MCG/2ML IJ SOLN: 25.0000 ug | INTRAMUSCULAR | Status: DC | PRN

## 2020-11-05 MED ORDER — FENTANYL CITRATE (PF) 100 MCG/2ML IJ SOLN
INTRAMUSCULAR | Status: DC | PRN
  Administered 2020-11-05: 50 ug via INTRAVENOUS

## 2020-11-05 MED ORDER — OXYCODONE HCL 5 MG/5ML PO SOLN: 5.0000 mg | Freq: Once | ORAL | Status: DC | PRN

## 2020-11-05 MED ORDER — PROPOFOL 10 MG/ML IV BOLUS
INTRAVENOUS | Status: AC
  Filled 2020-11-05: qty 20

## 2020-11-05 MED ORDER — DEXAMETHASONE SODIUM PHOSPHATE 10 MG/ML IJ SOLN
INTRAMUSCULAR | Status: DC | PRN
  Administered 2020-11-05 (×2): 5 mg via INTRAVENOUS

## 2020-11-05 MED ORDER — OXYCODONE HCL 5 MG PO TABS: 5.0000 mg | ORAL_TABLET | Freq: Once | ORAL | Status: DC | PRN

## 2020-11-05 MED ORDER — PROPOFOL 10 MG/ML IV BOLUS
INTRAVENOUS | Status: DC | PRN
  Administered 2020-11-05: 200 mg via INTRAVENOUS

## 2020-11-05 MED ORDER — ONDANSETRON HCL 4 MG/2ML IJ SOLN
INTRAMUSCULAR | Status: DC | PRN
  Administered 2020-11-05: 4 mg via INTRAVENOUS

## 2020-11-05 MED ORDER — IBUPROFEN 800 MG PO TABS: 800.0000 mg | ORAL_TABLET | Freq: Three times a day (TID) | ORAL | 1 refills | Status: DC | PRN

## 2020-11-05 MED ORDER — KETOROLAC TROMETHAMINE 30 MG/ML IJ SOLN: 30.0000 mg | Freq: Once | INTRAMUSCULAR | Status: DC

## 2020-11-05 MED ORDER — KETOROLAC TROMETHAMINE 30 MG/ML IJ SOLN: 30.0000 mg | Freq: Once | INTRAMUSCULAR | Status: DC | PRN

## 2020-11-05 MED ORDER — ACETAMINOPHEN 500 MG PO TABS
ORAL_TABLET | ORAL | Status: AC
  Filled 2020-11-05: qty 2

## 2020-11-05 MED ORDER — MIDAZOLAM HCL 2 MG/2ML IJ SOLN
INTRAMUSCULAR | Status: DC | PRN
  Administered 2020-11-05 (×2): 1 mg via INTRAVENOUS

## 2020-11-05 MED ORDER — LIDOCAINE HCL 1 % IJ SOLN
INTRAMUSCULAR | Status: DC | PRN
  Administered 2020-11-05: 10 mL

## 2020-11-05 MED ORDER — MIDAZOLAM HCL 2 MG/2ML IJ SOLN
INTRAMUSCULAR | Status: AC
  Filled 2020-11-05: qty 2

## 2020-11-05 MED ORDER — OXYCODONE-ACETAMINOPHEN 5-325 MG PO TABS: 1.0000 | ORAL_TABLET | ORAL | 0 refills | Status: AC | PRN

## 2020-11-05 MED ORDER — SODIUM CHLORIDE 0.9 % IR SOLN
Status: DC | PRN
  Administered 2020-11-05: 3000 mL

## 2020-11-05 SURGICAL SUPPLY — 22 items
CATH ROBINSON RED A/P 16FR (CATHETERS) ×2 IMPLANT
DEVICE MYOSURE LITE (MISCELLANEOUS) IMPLANT
DEVICE MYOSURE REACH (MISCELLANEOUS) IMPLANT
DILATOR CANAL MILEX (MISCELLANEOUS) IMPLANT
DRSG TELFA 3X8 NADH (GAUZE/BANDAGES/DRESSINGS) ×2 IMPLANT
GAUZE 4X4 16PLY ~~LOC~~+RFID DBL (SPONGE) ×4 IMPLANT
GLOVE SURG ENC MOIS LTX SZ6.5 (GLOVE) ×2 IMPLANT
GOWN STRL REUS W/TWL LRG LVL3 (GOWN DISPOSABLE) ×2 IMPLANT
IV NS IRRIG 3000ML ARTHROMATIC (IV SOLUTION) ×4 IMPLANT
KIT PROCEDURE FLUENT (KITS) ×2 IMPLANT
KIT TURNOVER CYSTO (KITS) ×2 IMPLANT
MYOSURE XL FIBROID (MISCELLANEOUS)
NDL SPNL 18GX3.5 QUINCKE PK (NEEDLE) IMPLANT
NEEDLE SPNL 18GX3.5 QUINCKE PK (NEEDLE) IMPLANT
PACK VAGINAL MINOR WOMEN LF (CUSTOM PROCEDURE TRAY) ×2 IMPLANT
PAD OB MATERNITY 4.3X12.25 (PERSONAL CARE ITEMS) ×2 IMPLANT
SEAL CERVICAL OMNI LOK (ABLATOR) IMPLANT
SEAL ROD LENS SCOPE MYOSURE (ABLATOR) ×2 IMPLANT
SWAB OB GYN 8IN STERILE 2PK (MISCELLANEOUS) ×1 IMPLANT
SYSTEM TISS REMOVAL MYOSURE XL (MISCELLANEOUS) IMPLANT
TOWEL OR 17X26 10 PK STRL BLUE (TOWEL DISPOSABLE) ×2 IMPLANT
WATER STERILE IRR 500ML POUR (IV SOLUTION) IMPLANT

## 2020-11-05 NOTE — Anesthesia Procedure Notes (Signed)
Procedure Name: LMA Insertion Date/Time: 11/05/2020 9:12 AM Performed by: Francie Massing, CRNA Pre-anesthesia Checklist: Patient identified, Emergency Drugs available, Suction available and Patient being monitored Patient Re-evaluated:Patient Re-evaluated prior to induction Oxygen Delivery Method: Circle system utilized Preoxygenation: Pre-oxygenation with 100% oxygen Induction Type: IV induction Ventilation: Mask ventilation without difficulty LMA: LMA inserted LMA Size: 4.0 Number of attempts: 1 Airway Equipment and Method: Bite block Placement Confirmation: positive ETCO2 Tube secured with: Tape Dental Injury: Teeth and Oropharynx as per pre-operative assessment

## 2020-11-05 NOTE — Discharge Instructions (Addendum)
  Post Anesthesia Home Care Instructions  Activity: Get plenty of rest for the remainder of the day. A responsible individual must stay with you for 24 hours following the procedure.  For the next 24 hours, DO NOT: -Drive a car -Advertising copywriter -Drink alcoholic beverages -Take any medication unless instructed by your physician -Make any legal decisions or sign important papers.  Meals: Start with liquid foods such as gelatin or soup. Progress to regular foods as tolerated. Avoid greasy, spicy, heavy foods. If nausea and/or vomiting occur, drink only clear liquids until the nausea and/or vomiting subsides. Call your physician if vomiting continues.  Special Instructions/Symptoms: Your throat may feel dry or sore from the anesthesia or the breathing tube placed in your throat during surgery. If this causes discomfort, gargle with warm salt water. The discomfort should disappear within 24 hours.   Next dose of Tylenol/acetaminophen after 1:30 PM today if needed.       DISCHARGE INSTRUCTIONS: D&C / D&E The following instructions have been prepared to help you care for yourself upon your return home.   Personal hygiene:  Use sanitary pads for vaginal drainage, not tampons.  Shower the day after your procedure.  NO tub baths, pools or Jacuzzis for 2-3 weeks.  Wipe front to back after using the bathroom.  Activity and limitations:  Do NOT drive or operate any equipment for 24 hours. The effects of anesthesia are still present and drowsiness may result.  Do NOT rest in bed all day.  Walking is encouraged.  Walk up and down stairs slowly.  You may resume your normal activity in one to two days or as indicated by your physician.  Sexual activity: NO intercourse for at least 2 weeks after the procedure, or as indicated by your physician.  Diet: Eat a light meal as desired this evening. You may resume your usual diet tomorrow.  Return to work: You may resume your work activities in one  to two days or as indicated by your doctor.  What to expect after your surgery: Expect to have vaginal bleeding/discharge for 2-3 days and spotting for up to 10 days. It is not unusual to have soreness for up to 1-2 weeks. You may have a slight burning sensation when you urinate for the first day. Mild cramps may continue for a couple of days. You may have a regular period in 2-6 weeks.  Call your doctor for any of the following:  Excessive vaginal bleeding, saturating and changing one pad every hour.  Inability to urinate 6 hours after discharge from hospital.  Pain not relieved by pain medication.  Fever of 100.4 F or greater.  Unusual vaginal discharge or odor.   Call for an appointment.     Call office with any concerns [phone]

## 2020-11-05 NOTE — Transfer of Care (Signed)
Immediate Anesthesia Transfer of Care Note  Patient: Regina Scott  Procedure(s) Performed: Procedure(s) (LRB): DILATATION & CURETTAGE/DIAGNOSTIC HYSTEROSCOPY (N/A)  Patient Location: PACU  Anesthesia Type: General  Level of Consciousness: awake, oriented, sedated and patient cooperative  Airway & Oxygen Therapy: Patient Spontanous Breathing and Patient connected to face mask oxygen  Post-op Assessment: Report given to PACU RN and Post -op Vital signs reviewed and stable  Post vital signs: Reviewed and stable  Complications: No apparent anesthesia complications Vitals Value Taken Time  BP 122/76 11/05/20 0945  Temp 36.6 C 11/05/20 0945  Pulse 81 11/05/20 0949  Resp 16 11/05/20 0949  SpO2 98 % 11/05/20 0949  Vitals shown include unvalidated device data.  Last Pain:  Vitals:   11/05/20 0723  TempSrc: Oral  PainSc: 0-No pain      Patients Stated Pain Goal: 7 (11/05/20 0723)  Complications: No notable events documented.

## 2020-11-05 NOTE — Anesthesia Preprocedure Evaluation (Signed)
Anesthesia Evaluation  Patient identified by MRN, date of birth, ID band Patient awake    Reviewed: Allergy & Precautions, NPO status , Patient's Chart, lab work & pertinent test results  Airway Mallampati: II  TM Distance: >3 FB Neck ROM: Full    Dental no notable dental hx.    Pulmonary neg pulmonary ROS,    Pulmonary exam normal breath sounds clear to auscultation       Cardiovascular negative cardio ROS Normal cardiovascular exam Rhythm:Regular Rate:Normal     Neuro/Psych negative neurological ROS  negative psych ROS   GI/Hepatic negative GI ROS, Neg liver ROS,   Endo/Other  negative endocrine ROS  Renal/GU negative Renal ROS  negative genitourinary   Musculoskeletal negative musculoskeletal ROS (+)   Abdominal   Peds negative pediatric ROS (+)  Hematology negative hematology ROS (+)   Anesthesia Other Findings   Reproductive/Obstetrics negative OB ROS                             Anesthesia Physical Anesthesia Plan  ASA: 1  Anesthesia Plan: General   Post-op Pain Management:    Induction: Intravenous  PONV Risk Score and Plan: 3 and Ondansetron, Dexamethasone, Treatment may vary due to age or medical condition and Midazolam  Airway Management Planned: LMA  Additional Equipment:   Intra-op Plan:   Post-operative Plan: Extubation in OR  Informed Consent: I have reviewed the patients History and Physical, chart, labs and discussed the procedure including the risks, benefits and alternatives for the proposed anesthesia with the patient or authorized representative who has indicated his/her understanding and acceptance.     Dental advisory given  Plan Discussed with: CRNA and Surgeon  Anesthesia Plan Comments:         Anesthesia Quick Evaluation

## 2020-11-05 NOTE — Op Note (Signed)
Operative Note    Preoperative Diagnosis:  Uterine synechae Dysmenorrhea Dysfunctional uterine bleeding    Postoperative Diagnosis: Same   Procedure: Hysteroscopy with dilation and curettage    Surgeon: Mindi Slicker, C DO   Anesthesia: LMA anesthesia   Fluids: EBL: <25ml UOP: pt voided just prior to OR IVF deficit: 25ml  Findings: Grossly normal uterine cavity with apparent synechae strands    Specimen: Endometrial curettings   Procedure Note  Consent was confirmed from  patient prior to OR.   Patient was taken to the operating room where LMA general anesthesia was administered without difficulty. She was then prepped and draped in the normal sterile fashion while in the dorsal lithotomy position. An appropriate time out was performed. An exam under anesthesia noted an anteverted uterus.  A speculum was then placed within the vagina and the anterior lip of the cervix identified and grasped with a single toothed tenaculum. Uterus was then sounded to 8cm.  The Pratt dilators were utilized to dilate the cervix up to approximately 19.  A hysteroscopy was then introduced and saline infused. The cavity appeared grossly normal, There was a strand of tissue noted to be flapping in the anterior and another in the posterior aspect of the uterus - likely from the synechae with had snapped with uterine distension from fluid. No bleeding noted. Both ostia were in normal anatomic positions.  At this time, a gently sharp curettage was performed with a gritty texture noted in all four quadrants.   The hysteroscope was reintroduced and cavity confirmed to ba appropriately sampled.  Next, all instruments were removed from the uterus. The tenaculum site was noted to be bleeding thus silver nitrate and monsels were applied with hemostasis noted.  The speculum was then removed. Pt was cleaned and awakened. She was taken to the recovery room in stable condition.  Counts were correct per nursing  staff during and at the end of the procedure

## 2020-11-05 NOTE — H&P (Signed)
Regina Scott is an 40 y.o. 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 She started on orilissa in 07/2018 with good control however would have resumption of painful and heavy menses when would stop use. She has completed two years of it and still having pain. Recent SIUS noted uterine synechia    Pt has chronic medical conditions of allergic rhinitis and history of right wrist and left ankle fractures.   Pt is interested in having another child next year.   Pertinent Gynecological History: Menses: with severe dysmenorrhea Bleeding: regular Contraception: none DES exposure: denies Blood transfusions: none Sexually transmitted diseases: no past history Previous GYN Procedures:  laparoscopy   Last mammogram:  n/a  Date:   Last pap: normal Date: 10/2020 OB History: G4, P2022   Menstrual History: Menarche age: 81 Patient's last menstrual period was 10/29/2020 (exact date).    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   w/  suction for missed ab   LAPAROSCOPIC SALPINGO OOPHERECTOMY Left 02/13/2017   Procedure: operative laparoscopy, left salpingo oophorectomy, lysis of adhesions, fulgeration of endometriosis;  Surgeon: Sherian Rein, MD;  Location: [facility];  Service: Gynecology;  Laterality: Left;  MD request RNFA    Family History  Problem Relation Age of Onset   Cancer Mother        breast   COPD Father    Diabetes Maternal Grandmother    Heart attack Maternal Grandmother     Social History:  reports that she has  never smoked. She has never used smokeless tobacco. She reports that she does not drink alcohol and does not use drugs.  Allergies:  Allergies  Allergen Reactions   Beef-Derived Products Nausea And Vomiting    "EXTREMELY SICK"   Penicillins Rash    Medications Prior to Admission  Medication Sig Dispense Refill Last Dose   cetirizine (ZYRTEC) 10 MG tablet Take 10 mg by mouth daily as needed.       ibuprofen (ADVIL,MOTRIN) 800 MG tablet Take 1 tablet (800 mg total) by mouth every 8 (eight) hours as needed for moderate pain. 20 tablet 0    rizatriptan (MAXALT) 10 MG tablet Take 10 mg by mouth as needed for migraine. May repeat in 2 hours if needed      sertraline (ZOLOFT) 100 MG tablet Take 100 mg by mouth every morning.       Review of Systems  Constitutional:  Negative for activity change and appetite change.  Eyes:  Negative for photophobia and visual disturbance.  Respiratory:  Negative for chest tightness and shortness of breath.   Gastrointestinal:  Positive for abdominal pain.  Genitourinary:  Positive for menstrual problem and pelvic pain.  Neurological:  Negative for light-headedness.  Psychiatric/Behavioral:  The patient is nervous/anxious.    Blood pressure 121/80, pulse 76, temperature 97.9 F (36.6 C), temperature source Oral, resp. rate 18, height 5\' 8"  (1.727 m), weight 84.1 kg, last menstrual period 10/29/2020, SpO2 99 %, unknown if currently breastfeeding. Physical Exam  Results for orders placed  or performed during the hospital encounter of 11/05/20 (from the past 24 hour(s))  Pregnancy, urine POC     Status: None   Collection Time: 11/05/20  7:05 AM  Result Value Ref Range   Preg Test, Ur NEGATIVE NEGATIVE    No results found.  Assessment/Plan: 25XI [id number] female here for hysteroscopic resection of uterine synechia likely with myosure - Admit - ERAS protocol - Review risks/benefits of procedure and post op expectations - Obtain consent  - To OR when  ready  Janean Sark Huxley Vanwagoner 11/05/2020, 8:53 AM

## 2020-11-05 NOTE — Anesthesia Postprocedure Evaluation (Signed)
Anesthesia Post Note  Patient: Regina Scott  Procedure(s) Performed: DILATATION & CURETTAGE/DIAGNOSTIC HYSTEROSCOPY (Uterus)     Patient location during evaluation: PACU Anesthesia Type: General Level of consciousness: awake and alert Pain management: pain level controlled Vital Signs Assessment: post-procedure vital signs reviewed and stable Respiratory status: spontaneous breathing, nonlabored ventilation, respiratory function stable and patient connected to nasal cannula oxygen Cardiovascular status: blood pressure returned to baseline and stable Postop Assessment: no apparent nausea or vomiting Anesthetic complications: no   No notable events documented.  Last Vitals:  Vitals:   11/05/20 1000 11/05/20 1015  BP: 115/79 106/75  Pulse: 68 62  Resp: 17 17  Temp:  36.6 C  SpO2: 97% 97%    Last Pain:  Vitals:   11/05/20 1015  TempSrc:   PainSc: 0-No pain                 Hiawatha Dressel S

## 2020-11-08 ENCOUNTER — Encounter (HOSPITAL_BASED_OUTPATIENT_CLINIC_OR_DEPARTMENT_OTHER): Payer: Self-pay | Admitting: Obstetrics and Gynecology

## 2020-11-08 LAB — SURGICAL PATHOLOGY

## 2021-09-22 LAB — OB RESULTS CONSOLE HIV ANTIBODY (ROUTINE TESTING): HIV: NONREACTIVE

## 2021-09-22 LAB — OB RESULTS CONSOLE RUBELLA ANTIBODY, IGM: Rubella: IMMUNE

## 2021-09-22 LAB — OB RESULTS CONSOLE GC/CHLAMYDIA
Chlamydia: NEGATIVE
Neisseria Gonorrhea: NEGATIVE

## 2021-09-22 LAB — OB RESULTS CONSOLE VARICELLA ZOSTER ANTIBODY, IGG: Varicella: IMMUNE

## 2021-09-22 LAB — OB RESULTS CONSOLE HEPATITIS B SURFACE ANTIGEN: Hepatitis B Surface Ag: NEGATIVE

## 2021-09-22 LAB — HEPATITIS C ANTIBODY: HCV Ab: NEGATIVE

## 2021-11-17 DIAGNOSIS — Z3A18 18 weeks gestation of pregnancy: Secondary | ICD-10-CM | POA: Diagnosis not present

## 2021-11-17 DIAGNOSIS — Z363 Encounter for antenatal screening for malformations: Secondary | ICD-10-CM | POA: Diagnosis not present

## 2021-11-17 DIAGNOSIS — Z3482 Encounter for supervision of other normal pregnancy, second trimester: Secondary | ICD-10-CM | POA: Diagnosis not present

## 2021-12-19 DIAGNOSIS — Z3482 Encounter for supervision of other normal pregnancy, second trimester: Secondary | ICD-10-CM | POA: Diagnosis not present

## 2021-12-19 DIAGNOSIS — Z362 Encounter for other antenatal screening follow-up: Secondary | ICD-10-CM | POA: Diagnosis not present

## 2021-12-19 DIAGNOSIS — Z3A23 23 weeks gestation of pregnancy: Secondary | ICD-10-CM | POA: Diagnosis not present

## 2022-01-17 DIAGNOSIS — O09522 Supervision of elderly multigravida, second trimester: Secondary | ICD-10-CM | POA: Diagnosis not present

## 2022-01-17 DIAGNOSIS — Z23 Encounter for immunization: Secondary | ICD-10-CM | POA: Diagnosis not present

## 2022-01-17 DIAGNOSIS — Z3A27 27 weeks gestation of pregnancy: Secondary | ICD-10-CM | POA: Diagnosis not present

## 2022-01-17 DIAGNOSIS — Z3689 Encounter for other specified antenatal screening: Secondary | ICD-10-CM | POA: Diagnosis not present

## 2022-01-17 LAB — OB RESULTS CONSOLE HIV ANTIBODY (ROUTINE TESTING): HIV: NONREACTIVE

## 2022-01-19 ENCOUNTER — Inpatient Hospital Stay (HOSPITAL_COMMUNITY)
Admission: AD | Admit: 2022-01-19 | Discharge: 2022-01-20 | Disposition: A | Discharge status: Home / Self Care | Payer: Medicaid Other | Attending: Obstetrics and Gynecology | Admitting: Obstetrics and Gynecology

## 2022-01-19 ENCOUNTER — Encounter (HOSPITAL_COMMUNITY): Payer: Self-pay | Admitting: Obstetrics and Gynecology

## 2022-01-19 DIAGNOSIS — X58XXXA Exposure to other specified factors, initial encounter: Secondary | ICD-10-CM | POA: Diagnosis not present

## 2022-01-19 DIAGNOSIS — M533 Sacrococcygeal disorders, not elsewhere classified: Secondary | ICD-10-CM | POA: Diagnosis not present

## 2022-01-19 DIAGNOSIS — Z3A27 27 weeks gestation of pregnancy: Secondary | ICD-10-CM

## 2022-01-19 DIAGNOSIS — O9A212 Injury, poisoning and certain other consequences of external causes complicating pregnancy, second trimester: Secondary | ICD-10-CM | POA: Insufficient documentation

## 2022-01-19 DIAGNOSIS — W19XXXA Unspecified fall, initial encounter: Secondary | ICD-10-CM | POA: Diagnosis not present

## 2022-01-19 DIAGNOSIS — O26892 Other specified pregnancy related conditions, second trimester: Secondary | ICD-10-CM | POA: Insufficient documentation

## 2022-01-19 LAB — POCT FERN TEST: POCT Fern Test: NEGATIVE

## 2022-01-19 MED ORDER — CALCIUM CARBONATE ANTACID 500 MG PO CHEW
400.0000 mg | CHEWABLE_TABLET | Freq: Four times a day (QID) | ORAL | Status: DC | PRN
  Administered 2022-01-19: 400 mg via ORAL
  Filled 2022-01-19: qty 2

## 2022-01-19 NOTE — MAU Provider Note (Signed)
Chief Complaint:  No chief complaint on file.   Event Date/Time   First Provider Initiated Contact with Patient 01/19/22 2156     HPI: Regina Scott is a 41 y.o. [id number] at 31w4dwho presents to maternity admissions reporting falling at home when a chair broke. Landed on buttocks which are painful now, mostly over coccyx.  Had some vaginal discharge with coughing, not soaking pads.  No bleeding.  Not feeling contractons. . She reports good fetal movement, denies LOF, vaginal bleeding, vaginal itching/burning, urinary symptoms, h/a, dizziness, n/v, diarrhea, constipation or fever/chills.    Fall She fell from a height of 1 to 2 ft. There was no blood loss. The point of impact was the buttocks. The pain is present in the buttocks. The symptoms are aggravated by pressure on injury and sitting. Pertinent negatives include no fever or numbness. She has tried nothing for the symptoms.   RN Note: Regina Scott is a 41 y.o. at [redacted]w[redacted]d here in MAU reporting: Sat in a broken chair and hit her bottom. She states she felt a gush of fluid with the impact. Continues to gush fluid when she coughs, not sure if it was amniotic fluid or urine.  +FM. Denies vaginal bleeding or ctx. Reports right sided abdominal pain, only mild, thinks it might be from fetal movement. Fell on her bottom so her bottom hurts.   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   Heart murmur    per pt was told during pregnancy's was told doctor hears at murmur , no work-up done and asymptomatic   Ovarian cyst, complex 02/09/2017   Pelvic pain 02/09/2017   Wears glasses     Past obstetric history: OB History  Gravida Para Term Preterm AB Living  5 2 2   2 2   SAB IAB Ectopic Multiple Live Births  1 1     2     # Outcome Date GA Lbr Len/2nd Weight Sex Delivery Anes PTL Lv  5 Current           4 Term 06/21/13 [redacted]w[redacted]d / 00:37  M Vag-Spont EPI  LIV  3 SAB 2013           2 IAB 2012          1 Term 2011  12:00 3827 g F Vag-Spont EPI  LIV    Past Surgical History: Past Surgical History:  Procedure Laterality Date   DILATATION & CURETTAGE/HYSTEROSCOPY WITH MYOSURE N/A 11/05/2020   Procedure: DILATATION & CURETTAGE/DIAGNOSTIC HYSTEROSCOPY;  Surgeon: Sherlyn Hay, DO;  Location: [facility];  Service: Gynecology;  Laterality: N/A;   DILATION AND CURETTAGE OF UTERUS  2013  approx.   w/  suction for missed ab   LAPAROSCOPIC SALPINGO OOPHERECTOMY Left 02/13/2017   Procedure: operative laparoscopy, left salpingo oophorectomy, lysis of adhesions, fulgeration of endometriosis;  Surgeon: Janyth Contes, MD;  Location: [facility];  Service: Gynecology;  Laterality: Left;  MD request RNFA    Family History: Family History  Problem Relation Age of Onset   Cancer Mother        breast   COPD Father    Diabetes Maternal Grandmother    Heart attack Maternal Grandmother     Social History: Social History   Tobacco Use   Smoking status: Never   Smokeless tobacco: Never  Vaping Use   Vaping Use: Never used  Substance Use Topics   Alcohol  use: No   Drug use: No    Allergies:  Allergies  Allergen Reactions   Beef-Derived Products Nausea And Vomiting    "EXTREMELY SICK"   Penicillins Rash    Meds:  Medications Prior to Admission  Medication Sig Dispense Refill Last Dose   Prenatal Vit-Fe Fumarate-FA (MULTIVITAMIN-PRENATAL) 27-0.8 MG TABS tablet Take 1 tablet by mouth daily at 12 noon.   01/18/2022   cetirizine (ZYRTEC) 10 MG tablet Take 10 mg by mouth daily as needed.       ibuprofen (ADVIL) 800 MG tablet Take 1 tablet (800 mg total) by mouth every 8 (eight) hours as needed for moderate pain or cramping. 20 tablet 1    rizatriptan (MAXALT) 10 MG tablet Take 10 mg by mouth as needed for migraine. May repeat in 2 hours if needed      sertraline (ZOLOFT) 100 MG tablet Take 100 mg by mouth every morning.        I have reviewed patient's Past Medical Hx, Surgical Hx, Family Hx, Social Hx, medications and allergies.   ROS:  Review of Systems  Constitutional:  Negative for chills and fever.  Cardiovascular:  Negative for leg swelling.  Genitourinary:  Positive for vaginal discharge. Negative for pelvic pain and vaginal bleeding.  Musculoskeletal:  Negative for myalgias.       Pain over coccyx  Neurological:  Negative for weakness and numbness.   Other systems negative  Physical Exam  Patient Vitals for the past 24 hrs:  BP Temp Temp src Pulse Resp SpO2 Height Weight  01/19/22 2135 -- -- -- -- -- 95 % -- --  01/19/22 2134 110/63 -- -- 95 -- -- -- --  01/19/22 2108 130/80 98 F (36.7 C) Oral 92 17 100 % 5\' 8"  (1.727 m) 98.1 kg   Constitutional: Well-developed, well-nourished female in no acute distress.  Cardiovascular: normal rate and rhythm Respiratory: normal effort GI: Abd soft, non-tender, gravid appropriate for gestational age.   No rebound or guarding. MS: Extremities nontender, no edema, normal ROM   Tender over coxxyx and sacrum Neurologic: Alert and oriented x 4.  GU: Neg CVAT.  PELVIC EXAM: Cervix pink, visually closed, without lesion, scant white creamy discharge, vaginal walls and external genitalia normal   No pooling   No ferning   FHT:  Baseline 145 , moderate variability, accelerations present, no decelerations Contractions: Occasional   Labs: Results for orders placed or performed during the hospital encounter of 01/19/22 (from the past 24 hour(s))  Fern Test     Status: Normal   Collection Time: 01/19/22 10:32 PM  Result Value Ref Range   POCT Fern Test Negative = intact amniotic membranes      Imaging:  No results found.  MAU Course/MDM: I have reviewed the triage vital signs and the nursing notes.   Pertinent labs & imaging results that were available during my care of the patient were reviewed by me and considered in my medical decision making (see  chart for details).      I have reviewed her medical records including past results, notes and treatments.   NST reviewed, reassuring throughout the 4 hours we monitored her.  No contractions or abdominal tenderness  Treatments in MAU included EFM, Ice to sacrum   Declines analgesics.    Assessment: Single IUP at [redacted]w[redacted]d Fall onto buttocks Coccyx and sacral tenderness/contusion  Plan: Discharge home Preterm Labor precautions and fetal kick counts Ice to sacrum for 24 hours.  Follow up in  Office for prenatal visits  Encouraged to return if she develops worsening of symptoms, increase in pain, fever, or other concerning symptoms.   Pt stable at time of discharge.  Hansel Feinstein CNM, MSN Certified Nurse-Midwife 01/19/2022 9:56 PM

## 2022-01-19 NOTE — MAU Note (Addendum)
..  Regina Scott is a 41 y.o. at [redacted]w[redacted]d here in MAU reporting: Sat in a broken chair and hit her bottom. She states she felt a gush of fluid with the impact. Continues to gush fluid when she coughs, not sure if it was amniotic fluid or urine.  +FM. Denies vaginal bleeding or ctx. Reports right sided abdominal pain, only mild, thinks it might be from fetal movement. Fell on her bottom so her bottom hurts.   Pain score: 7/10 bottom.  Vitals:   01/19/22 2108  BP: 130/80  Pulse: 92  Resp: 17  Temp: 98 F (36.7 C)  SpO2: 100%     FHT:147 Lab orders placed from triage:  none

## 2022-01-20 DIAGNOSIS — W19XXXA Unspecified fall, initial encounter: Secondary | ICD-10-CM | POA: Diagnosis not present

## 2022-01-20 DIAGNOSIS — Z3A27 27 weeks gestation of pregnancy: Secondary | ICD-10-CM

## 2022-01-20 DIAGNOSIS — M533 Sacrococcygeal disorders, not elsewhere classified: Secondary | ICD-10-CM

## 2022-01-24 DIAGNOSIS — R7309 Other abnormal glucose: Secondary | ICD-10-CM | POA: Diagnosis not present

## 2022-02-06 NOTE — L&D Delivery Note (Signed)
Delivery Note Regina Scott is a [id number] at 40w3dwho had a spontaneous delivery at 156a viable female was delivered via  LOA.  APGAR: 8,9 ; weight  3670g (8lb 1.5oz) .     Admitted for term induction for AMA. Induced with pitocin and AROM. Progressed slowly. Developed chorioamnionitis. Received epidural for pain management. Pushed for 90 minutes. Baby was delivered without difficulty. No nuchal cord.  Delayed cord clamping for 60 seconds.  Delivery of placenta was spontaneous. Placenta was found to be intact, 3 -vessel cord was noted. The fundus was found to be firm. 1st degree perineal laceration was repaired in the normal sterile fashion with 2-0 vicryl. Significant swelling of vulva/vagina with oozing from vaginal bed, TXA 1g given and pressure applied. Good hemostasis obtained. Estimated blood loss 200cc. Instrument and gauze counts were correct at the end of the procedure.   Placenta status: to pathology due to chorioamnionitis  Anesthesia:  epidural Episiotomy:  none Lacerations:  1st degree perineal Suture Repair: 2.0 vicryl Est. Blood Loss (mL):  200  Mom to postpartum.  Baby to Couplet care / Skin to Skin.  MRowland Lathe3/07/2022, 4:14 PM

## 2022-02-07 ENCOUNTER — Other Ambulatory Visit: Payer: Self-pay

## 2022-02-07 ENCOUNTER — Ambulatory Visit: Payer: Medicaid Other | Attending: Obstetrics and Gynecology

## 2022-02-07 DIAGNOSIS — M6281 Muscle weakness (generalized): Secondary | ICD-10-CM | POA: Diagnosis not present

## 2022-02-07 DIAGNOSIS — M62838 Other muscle spasm: Secondary | ICD-10-CM

## 2022-02-07 DIAGNOSIS — R293 Abnormal posture: Secondary | ICD-10-CM

## 2022-02-07 DIAGNOSIS — R279 Unspecified lack of coordination: Secondary | ICD-10-CM | POA: Diagnosis not present

## 2022-02-07 DIAGNOSIS — M549 Dorsalgia, unspecified: Secondary | ICD-10-CM | POA: Insufficient documentation

## 2022-02-07 DIAGNOSIS — M5459 Other low back pain: Secondary | ICD-10-CM

## 2022-02-07 NOTE — Therapy (Signed)
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   Ovarian cyst, complex 02/09/2017   Pelvic pain 02/09/2017   Postpartum care following vaginal delivery (5/16) 06/21/2013   SVD (spontaneous vaginal delivery) 06/21/2013    PCP: NA  REFERRING PROVIDER: Lavina Hamman, MD  REFERRING DIAG: M54.9 (ICD-10-CM) - Dorsalgia, unspecified  THERAPY DIAG:  Other low back pain  Abnormal posture  Muscle weakness (generalized)  Other muscle spasm  Unspecified lack of coordination  Rationale for Evaluation  and Treatment: Rehabilitation  ONSET DATE: a month ago  SUBJECTIVE:                                                                                                                                                                                           02/07/22 SUBJECTIVE STATEMENT: Pt is 7 months pregnant; due date is 04/16/22 [name] is son). Primary complaint of low back back pain on Rt and radiating pain into Rt LE in the back. Pain feels like it is pinching in her low back and then difficult to put pressure on Rt side for several minutes.  Fluid intake: Yes: a  lot of water    PAIN:  Are you having pain? Yes NPRS scale: 7/10, 10/10 worst Pain location: Right and low back/hip/LE  Pain type: pinching Pain description: intermittent   Aggravating factors: sporadic, but tends to be worse first thing in the morning Relieving factors: nothing  PRECAUTIONS: None  WEIGHT BEARING RESTRICTIONS: No  FALLS:  Has patient fallen in last 6 months? No and Yes. Number of falls 1  [address] out of broken chair onto tailbone.   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 2019 with adhesion removal  Sexual abuse: No  BOWEL MOVEMENT: Pain with bowel movement: No Type of bowel movement:Frequency at least every other day if not every day and Strain Yes Fully empty rectum: Yes: - Leakage: No Pads: No Fiber supplement: No  URINATION: Pain with urination: No Fully empty bladder: No - will leak after going to the bathroom Stream: Strong Urgency: No Frequency: At least every hour Leakage: Coughing, Sneezing, and Laughing Pads: No  INTERCOURSE: Pain with intercourse:  no pain   PREGNANCY: Vaginal  deliveries 2 Tearing Yes: with first and did have to have stitches C-section deliveries 0 Currently pregnant Yes: 7 months  PROLAPSE: None   OBJECTIVE:  02/07/22:   PATIENT SURVEYS:   [id number]  COGNITION: Overall cognitive status: Within functional limits for tasks assessed     SENSATION: Light touch: Appears intact Proprioception: Appears intact  MUSCLE LENGTH:   FUNCTIONAL TESTS: Lt weight shift, anterior weight shift, heel lift Single leg stance: 10 sec bil, less table on Rt and took a minute to get balance   GAIT: Comments: Bil LE circumduction, decreased bil hip extension, increased lumbar lordosis/anterior pelvic tilt  POSTURE: increased lumbar lordosis, decreased thoracic kyphosis, and elevated Rt shoulder, elevated Lt iliac crest , scoliosis, Rt thoracic curve, Lt lumbar curve  LUMBARAROM/PROM:  A/PROM A/PROM  Eval (% available)  Flexion 50  Extension 50  Right lateral flexion 75  Left lateral flexion 75, pull in Rt side  Right rotation 50  Left rotation 50    (Blank rows = not tested)  LOWER EXTREMITY ROM: decreased bil hip extension    LOWER EXTREMITY MMT:  MMT Right eval Left eval  Hip flexion 3 4-  Hip extension    Hip abduction 3+ 4-  Hip adduction 3+ 4-  Hip internal rotation 4- 4-  Hip external rotation 4-, pain 4-  Knee flexion    Knee extension    Ankle dorsiflexion    Ankle plantarflexion    Ankle inversion    Ankle eversion     PALPATION:   General  tenderness throughout Rt low back/hip; pinpoint tenderness over Rt SIJ; abdominal weakness with 3 finger width distortion upon increased abdominal pressure                External Perineal Exam NA                             Internal Pelvic Floor NA  Patient confirms identification and approves PT to assess internal pelvic floor and treatment Yes, if needed in future treatment sessions   PELVIC MMT: NA   MMT eval  Vaginal   Internal Anal Sphincter  External Anal  Sphincter   Puborectalis   Diastasis Recti   (Blank rows = not tested)        TONE: NA  PROLAPSE: NA  TODAY'S TREATMENT:                                                                                                                              DATE: 02/07/22  EVAL  Neuromuscular re-education: Transversus abdominus training with multimodal cues for improved motor control and breath coordination Seated hip abduction Seated hip adduction Pelvic tilts  Therapeutic activities: Double voiding  Squatty potty/relaxed toileting mechanics Fiber intake  Check all possible CPT codes: 71062- Neuro Re-education and 97530 - Therapeutic Activities    Check all conditions that are expected to impact treatment: Current pregnancy or recent postpartum   If treatment provided at initial evaluation, no treatment charged due to lack of authorization.       PATIENT EDUCATION:  Education details: see above Person educated: Patient Education method: Explanation, Demonstration, Tactile cues, Verbal cues, and Handouts Education comprehension: verbalized understanding  HOME EXERCISE PROGRAM: [id number]  ASSESSMENT:  CLINICAL IMPRESSION: Patient is a 42 y.o. female, 7 month pregnant, who was seen today for physical therapy evaluation and treatment for Rt sided low back pain and hip pain. Exam findings notable for core weakness, bil hip weakness, decreased lumbar A/ROM, tenderness and muscle spasm throughout Rt low back/hip, abnormal posture, abnormal gait pattern, and decrease in functional ability with poor squat form. Signs and symptoms are most consistent with pelvic instability in pregnancy that is exacerbated by inactivity and underlying weakness. She tolerated initial treatment of core activation training and pelvic stability exercises well with no increase in pain reported. She will continue to benefit form skilled PT intervention in order to decrease pain, improve functional ability, prepare  for delivery, and address impairments.  OBJECTIVE IMPAIRMENTS: decreased activity tolerance, decreased coordination, decreased endurance, decreased strength, increased fascial restrictions, increased muscle spasms, impaired tone, postural dysfunction, and pain.   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 2019 with adhesion removal  are also affecting patient's functional outcome.   REHAB POTENTIAL: Good  CLINICAL DECISION MAKING: Stable/uncomplicated  EVALUATION COMPLEXITY: Low   GOALS: Goals reviewed with patient? Yes  SHORT TERM GOALS: Target date: 03/14/21  Pt will be independent with HEP.   Baseline: Goal status: INITIAL  2.  Pt will increase all impaired lumbar A/ROM by 25% without pain.  Baseline:  Goal status: INITIAL  3.  Pt will be independent with use of squatty potty, relaxed toileting mechanics, and improved bowel movement techniques in order to increase ease of bowel movements and complete evacuation.   Baseline:  Goal status: INITIAL  4.  Pt will be independent with the knack, urge suppression technique, and double  voiding in order to improve bladder habits and decrease urinary incontinence.   Baseline:  Goal status: INITIAL   LONG TERM GOALS: Target date: 05/02/22  Pt will be independent with advanced HEP.   Baseline:  Goal status: INITIAL  2.  Pt will demonstrate increase in all impaired hip strength by 1 muscle grades in order to demonstrate improved lumbopelvic support and increase functional ability.   Baseline:  Goal status: INITIAL  3.  Pt will be independent with diaphragmatic breathing and down training activities in order to improve pelvic floor  relaxation.  Baseline:  Goal status: INITIAL  4.  Pt will demonstrate normal pelvic floor muscle tone and A/ROM, able to achieve 4/5 strength with contractions and 10 sec endurance, in order to provide appropriate lumbopelvic support in functional activities.   Baseline:  Goal status: INITIAL  5.  Pt will be independent with pain management techniques and HEP to manage discomfort and pelvic instability throughout remainder of pregnancy. Baseline:  Goal status: INITIAL    PLAN:  PT FREQUENCY: 1-2x/week  PT DURATION: 1 week  PLANNED INTERVENTIONS: Therapeutic exercises, Therapeutic activity, Neuromuscular re-education, Balance training, Gait training, Patient/Family education, Self Care, Joint mobilization, Dry Needling, Biofeedback, and Manual therapy  PLAN FOR NEXT SESSION: Manual techniques to decrease pain and tension in Rt hip/low back; progress pelvic stability exercises; mobility activities. Try pelvic support belt.    Heather Roberts, PT, DPT01/03/2410:12 PM

## 2022-02-07 NOTE — Patient Instructions (Signed)
Squatty potty: When your knees are level or below the level of your hips, pelvic floor muscles are pressed against rectum, preventing ease of bowel movement. By getting knees above the level of the hips, these pelvic floor muscles relax, allowing easier passage of bowel movement. ? Ways to get knees above hips: o Squatty Potty (7inch and 9inch versions) o Small stool o Roll of toilet paper under each foot o Hardback book or stack of magazines under each foot  Relaxed Toileting mechanics: Once in this position, make sure to lean forward with forearms on thighs, wide knees, relaxed stomach, and breathe.  Fiber supplement: Psyllium husk   Double-voiding:  This technique is to help with post-void dribbling, or leaking a little bit when you stand up right after urinating.  Use relaxed toileting mechanics to urinate as much as you feel like you have to without straining.  Sit back upright from leaning forward and relax this way for 10-20 seconds.  Lean forward again to finish voiding any amount more.  [facility] [address], [place], [place] [address] Phone # [phone] Fax [phone]

## 2022-02-14 ENCOUNTER — Ambulatory Visit: Payer: Medicaid Other

## 2022-02-14 DIAGNOSIS — R279 Unspecified lack of coordination: Secondary | ICD-10-CM | POA: Diagnosis not present

## 2022-02-14 DIAGNOSIS — M549 Dorsalgia, unspecified: Secondary | ICD-10-CM | POA: Diagnosis not present

## 2022-02-14 DIAGNOSIS — R293 Abnormal posture: Secondary | ICD-10-CM

## 2022-02-14 DIAGNOSIS — M6281 Muscle weakness (generalized): Secondary | ICD-10-CM

## 2022-02-14 DIAGNOSIS — M62838 Other muscle spasm: Secondary | ICD-10-CM

## 2022-02-14 DIAGNOSIS — M5459 Other low back pain: Secondary | ICD-10-CM

## 2022-02-14 NOTE — Therapy (Signed)
OUTPATIENT PHYSICAL THERAPY TREATMENT NOTE   Patient Name: Regina Scott MRN: [id number] DOB:March 09, 1980, 42 y.o., female Today's Date: 02/14/2022  PCP: NA REFERRING PROVIDER: Lavina Hamman, MD  END OF SESSION:   PT End of Session - 02/14/22 0900     Visit Number 2    Date for PT Re-Evaluation 05/02/22    Authorization Type [employer]    PT Start Time [phone]    PT Stop Time 0930    PT Time Calculation (min) 31 min    Activity Tolerance Patient tolerated treatment well    Behavior During Therapy [facility] for tasks assessed/performed             Past Medical History:  Diagnosis Date   Constipation    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 2019 with adhesion removal   PRECAUTIONS: Pregnant - due date 04/16/22  SUBJECTIVE:                                                                                                                                                                                      SUBJECTIVE STATEMENT:  Pt states that she was out of town over the weekend and slept in a very firm bed that  caused large increase in pain.    PAIN:  Are you having pain? Yes: NPRS scale: 4/10 Pain location: Rt low back Pain description: pinching Aggravating factors: sporadic Relieving factors: nothing  02/07/22 SUBJECTIVE STATEMENT: Pt is 7 months pregnant; due date is 04/16/22 Regina Scott is son). Primary complaint of low back back pain on Rt and radiating pain into Rt LE in the back. Pain feels like it is pinching in her low back and then difficult to put pressure on Rt side for several minutes.  Fluid intake: Yes: a lot of water     PAIN:  Are you having pain? Yes NPRS scale: 7/10, 10/10 worst Pain location: Right and low back/hip/LE   Pain type: pinching Pain description: intermittent    Aggravating factors: sporadic, but tends to be worse first thing in the morning Relieving factors: nothing   PRECAUTIONS: None   WEIGHT BEARING RESTRICTIONS: No   FALLS:  Has patient fallen in last 6 months? No and Yes. Number of falls 1  [address] out of broken chair onto tailbone.    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 2019 with adhesion removal  Sexual abuse: No   BOWEL MOVEMENT: Pain with bowel movement: No Type of bowel movement:Frequency at least every other day if not every day and Strain Yes Fully empty rectum: Yes: - Leakage: No Pads: No Fiber supplement: No   URINATION: Pain with urination: No Fully empty bladder: No - will leak after going to the bathroom Stream: Strong Urgency: No Frequency: At least every hour Leakage: Coughing, Sneezing, and Laughing Pads: No   INTERCOURSE: Pain with intercourse:  no pain     PREGNANCY: Vaginal deliveries 2 Tearing Yes: with first and did have to have stitches C-section deliveries 0 Currently pregnant Yes: 7 months   PROLAPSE: None     OBJECTIVE:  02/07/22:    PATIENT SURVEYS:    [id number] [id number]   COGNITION: Overall cognitive status: Within functional limits for tasks assessed                          SENSATION: Light touch: Appears intact Proprioception: Appears intact   MUSCLE LENGTH:     FUNCTIONAL TESTS: Lt weight shift, anterior weight shift, heel lift Single leg stance: 10 sec bil, less table on Rt and took a minute to get balance     GAIT: Comments: Bil LE circumduction, decreased bil hip extension, increased lumbar lordosis/anterior pelvic tilt   POSTURE: increased lumbar lordosis, decreased thoracic kyphosis, and elevated Rt shoulder, elevated Lt iliac crest , scoliosis, Rt thoracic curve, Lt lumbar curve   LUMBARAROM/PROM:   A/PROM A/PROM  Eval (% available)  Flexion 50  Extension 50  Right lateral flexion 75  Left lateral flexion 75, pull in Rt side  Right rotation 50  Left rotation 50    (Blank rows = not tested)   LOWER EXTREMITY ROM: decreased bil hip extension       LOWER EXTREMITY MMT:   MMT Right eval Left eval  Hip flexion 3 4-  Hip extension      Hip abduction 3+ 4-  Hip adduction 3+ 4-  Hip internal rotation 4- 4-  Hip  external rotation 4-, pain 4-  Knee flexion      Knee extension      Ankle dorsiflexion      Ankle plantarflexion      Ankle inversion      Ankle eversion        PALPATION:   General  tenderness throughout Rt low back/hip; pinpoint tenderness over Rt SIJ; abdominal weakness with 3 finger width distortion upon increased abdominal pressure                 External Perineal Exam NA                             Internal Pelvic Floor NA   Patient confirms identification and approves PT to assess internal pelvic floor and treatment Yes, if needed in future treatment sessions    PELVIC MMT: NA   MMT eval  Vaginal    Internal Anal Sphincter    External Anal Sphincter    Puborectalis    Diastasis Recti    (Blank rows = not tested)         TONE: NA   PROLAPSE: NA   TODAY'S TREATMENT 02/14/22:    Manual: Soft tissue mobilization to Rt hip/low back  Exercises: Swiss ball exercises: Circles Lateral glides Pelvic tilts Marching Asymmetrical glide bil Standing table rocks side to side Standing table rocks with one foot propped Seated piriformis stretch 60 sec bil Weight shifting with core activation                                                                                                                            DATE: 02/07/22  EVAL  Neuromuscular re-education: Transversus abdominus training with multimodal cues for improved motor control and breath coordination Seated hip abduction Seated hip adduction Pelvic tilts   Therapeutic activities: Double voiding  Squatty potty/relaxed toileting mechanics Fiber intake   Check all possible CPT codes: 48185- Neuro Re-education and 97530 - Therapeutic Activities                        Check all conditions that are expected to impact treatment: Current pregnancy or recent postpartum        If treatment provided at initial evaluation, no treatment charged due to lack of authorization.                                 PATIENT EDUCATION:  Education details: see above Person educated: Patient Education method: Explanation, Demonstration, Tactile cues, Verbal cues, and Handouts Education comprehension: verbalized understanding   HOME EXERCISE PROGRAM: [id number]   ASSESSMENT:   CLINICAL IMPRESSION: Pt had more pain over the last week due to travel and was unable to perform initial HEP. She did well during today's session and all exercise progressions, but did have difficulty with core activation  during pelvic tilts and marching on the ball. Good tolerance to soft tissue mobilization with decrease in tension surrounding Rt SIJ. She still had difficulty with weight shifting onto Rt side at end of treatment session, but improved when actively thinking about core activation. She is wearing belt appropriately; she did not get any great relief with pelvic stability belt. She will continue to benefit form skilled PT intervention in order to decrease pain, improve functional ability, prepare for delivery, and address impairments.   OBJECTIVE IMPAIRMENTS: decreased activity tolerance, decreased coordination, decreased endurance, decreased strength, increased fascial restrictions, increased muscle spasms, impaired tone, postural dysfunction, and pain.    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 2019 with adhesion removal  are also affecting patient's functional outcome.    REHAB POTENTIAL: Good   CLINICAL DECISION MAKING: Stable/uncomplicated   EVALUATION COMPLEXITY: Low     GOALS: Goals reviewed with patient? Yes   SHORT TERM GOALS: Target date: 03/14/21   Pt will be independent with HEP.    Baseline: Goal status: INITIAL    2.  Pt will increase all impaired lumbar A/ROM by 25% without pain.   Baseline:  Goal status: INITIAL   3.  Pt will be independent with use of squatty potty, relaxed toileting mechanics, and improved bowel movement techniques in order to increase ease of bowel movements and complete evacuation.    Baseline:  Goal status: INITIAL   4.  Pt will be independent with the knack, urge suppression technique, and double voiding in order to improve bladder habits and decrease urinary incontinence.    Baseline:  Goal status: INITIAL     LONG TERM GOALS: Target date: 05/02/22   Pt will be independent with advanced HEP.    Baseline:  Goal status: INITIAL   2.  Pt will demonstrate increase in all impaired hip strength by 1 muscle grades in order to demonstrate improved lumbopelvic support and increase functional ability.    Baseline:  Goal status: INITIAL   3.  Pt will be independent with diaphragmatic breathing and down training activities in order to improve pelvic floor relaxation.   Baseline:  Goal status: INITIAL   4.  Pt will demonstrate normal pelvic floor muscle tone and A/ROM, able to achieve 4/5 strength with contractions and 10 sec endurance, in order to provide appropriate lumbopelvic support in functional activities.    Baseline:  Goal status: INITIAL   5.  Pt will be independent with pain management techniques and HEP to manage discomfort and pelvic instability throughout remainder of pregnancy. Baseline:  Goal status: INITIAL       PLAN:   PT FREQUENCY: 1-2x/week   PT DURATION: 1 week   PLANNED INTERVENTIONS: Therapeutic exercises, Therapeutic activity, Neuromuscular re-education, Balance training, Gait training, Patient/Family education, Self Care, Joint mobilization, Dry Needling, Biofeedback, and Manual therapy   PLAN FOR NEXT SESSION: Manual techniques to decrease pain and tension in Rt hip/low back; progress pelvic stability exercises; mobility activities.     Julio Alm, PT, DPT01/10/2408:21 AM

## 2022-02-21 ENCOUNTER — Ambulatory Visit: Payer: Medicaid Other

## 2022-02-28 ENCOUNTER — Ambulatory Visit: Payer: Medicaid Other

## 2022-02-28 DIAGNOSIS — M5459 Other low back pain: Secondary | ICD-10-CM

## 2022-02-28 DIAGNOSIS — M62838 Other muscle spasm: Secondary | ICD-10-CM

## 2022-02-28 DIAGNOSIS — R293 Abnormal posture: Secondary | ICD-10-CM | POA: Diagnosis not present

## 2022-02-28 DIAGNOSIS — M549 Dorsalgia, unspecified: Secondary | ICD-10-CM | POA: Diagnosis not present

## 2022-02-28 DIAGNOSIS — M6281 Muscle weakness (generalized): Secondary | ICD-10-CM | POA: Diagnosis not present

## 2022-02-28 DIAGNOSIS — R279 Unspecified lack of coordination: Secondary | ICD-10-CM

## 2022-02-28 NOTE — Therapy (Signed)
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 2019 with adhesion removal   PRECAUTIONS: Pregnant - due date 04/16/22  SUBJECTIVE:                                                                                                                                                                                      SUBJECTIVE STATEMENT:  Pt states that she overdid over the weekend and was in a lot of pain that day and part of the next day.    PAIN:  Are you having pain? Yes: NPRS scale: 0/10 Pain  location: Rt low back Pain description: pinching Aggravating factors: sporadic Relieving factors: nothing  02/07/22 SUBJECTIVE STATEMENT: Pt is 7 months pregnant; due date is 04/16/22 [name] is son). Primary complaint of low back back pain on Rt and radiating pain into Rt LE in the back. Pain feels like it is pinching in her low back and then difficult to put pressure on Rt side for several minutes.  Fluid intake: Yes: a lot of water     PAIN:  Are you having pain? Yes NPRS scale: 7/10, 10/10 worst Pain location: Right and low back/hip/LE   Pain type: pinching Pain description: intermittent    Aggravating factors: sporadic, but tends to be worse first thing in the morning Relieving factors: nothing   PRECAUTIONS: None   WEIGHT BEARING RESTRICTIONS: No   FALLS:  Has patient fallen in last 6 months? No and Yes. Number of falls 1  [address] out of broken chair onto tailbone.    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 2019 with adhesion removal  Sexual abuse: No   BOWEL  MOVEMENT: Pain with bowel movement: No Type of bowel movement:Frequency at least every other day if not every day and Strain Yes Fully empty rectum: Yes: - Leakage: No Pads: No Fiber supplement: No   URINATION: Pain with urination: No Fully empty bladder: No - will leak after going to the bathroom Stream: Strong Urgency: No Frequency: At least every hour Leakage: Coughing, Sneezing, and Laughing Pads: No   INTERCOURSE: Pain with intercourse:  no pain     PREGNANCY: Vaginal deliveries 2 Tearing Yes: with first and did have to have stitches C-section deliveries 0 Currently pregnant Yes: 7 months   PROLAPSE: None     OBJECTIVE:  02/07/22:    PATIENT SURVEYS:    [id number] [id number]   COGNITION: Overall cognitive status: Within functional limits for tasks assessed                          SENSATION: Light touch: Appears intact Proprioception: Appears intact   MUSCLE LENGTH:     FUNCTIONAL TESTS: Lt weight shift, anterior weight shift, heel lift Single leg stance: 10 sec bil, less table on Rt and took a minute to get balance     GAIT: Comments: Bil LE circumduction, decreased bil hip extension, increased lumbar lordosis/anterior pelvic tilt   POSTURE: increased lumbar lordosis, decreased thoracic kyphosis, and elevated Rt shoulder, elevated Lt iliac crest , scoliosis, Rt thoracic curve, Lt lumbar curve   LUMBARAROM/PROM:   A/PROM A/PROM  Eval (% available)  Flexion 50  Extension 50  Right lateral flexion 75  Left lateral flexion 75, pull in Rt side  Right rotation 50  Left rotation 50    (Blank rows = not tested)   LOWER EXTREMITY ROM: decreased bil hip extension       LOWER EXTREMITY MMT:   MMT Right eval Left eval  Hip flexion 3 4-  Hip extension      Hip abduction 3+ 4-  Hip adduction 3+ 4-  Hip internal rotation 4- 4-  Hip external rotation 4-, pain 4-  Knee flexion      Knee extension  Ankle dorsiflexion      Ankle plantarflexion       Ankle inversion      Ankle eversion        PALPATION:   General  tenderness throughout Rt low back/hip; pinpoint tenderness over Rt SIJ; abdominal weakness with 3 finger width distortion upon increased abdominal pressure                 External Perineal Exam NA                             Internal Pelvic Floor NA   Patient confirms identification and approves PT to assess internal pelvic floor and treatment Yes, if needed in future treatment sessions    PELVIC MMT: NA   MMT eval  Vaginal    Internal Anal Sphincter    External Anal Sphincter    Puborectalis    Diastasis Recti    (Blank rows = not tested)         TONE: NA   PROLAPSE: NA   TODAY'S TREATMENT 02/27/22 Manual: Muscle energy techniques for correction of pelvic rotation followed by shotgun technique Exercises: Swiss ball exercises: Circles Lateral glides Pelvic tilts D2 10x bil Single arm overhead press 5lb 10x bil horizontal abduction 2 x 10 Therapeutic activities: Squats with anterior weight hold 10x     TREATMENT 02/14/22:    Manual: Soft tissue mobilization to Rt hip/low back  Exercises: Swiss ball exercises: Circles Lateral glides Pelvic tilts Marching Asymmetrical glide bil Standing table rocks side to side Standing table rocks with one foot propped Seated piriformis stretch 60 sec bil Weight shifting with core activation                                                                                                     PATIENT EDUCATION:  Education details: see above Person educated: Patient Education method: Programmer, multimedia, Facilities manager, Actor cues, Verbal cues, and Handouts Education comprehension: verbalized understanding   HOME EXERCISE PROGRAM: [id number]   ASSESSMENT:   CLINICAL IMPRESSION: Patient started having Rt SIJ pain when dong stretching lunges on swiss ball with Rt leg forward/Lt leg back. Due to this, we checked for pelvic rotation and found that she has a  posterior Rt innominate rotation. Muscle energy techniques with good decrease in pain back to baseline. She was unable to perform single leg stance activities on the swiss ball today due to stability and Rt SIJ pain. Kick stand dead lifts were also painful and not performed. She performed muscle energy techniques again at end of session to bring pain levels back down. She was instructed to perform this throughout the week as needed. She will continue to benefit form skilled PT intervention in order to decrease pain, improve functional ability, prepare for delivery, and address impairments.   OBJECTIVE IMPAIRMENTS: decreased activity tolerance, decreased coordination, decreased endurance, decreased strength, increased fascial restrictions, increased muscle spasms, impaired tone, postural dysfunction, and pain.    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 2019 with adhesion removal  are also affecting patient's functional outcome.    REHAB POTENTIAL: Good   CLINICAL DECISION MAKING: Stable/uncomplicated   EVALUATION COMPLEXITY: Low     GOALS: Goals reviewed with patient? Yes   SHORT TERM GOALS: Target date: 03/14/21   Pt will be independent with HEP.    Baseline: Goal status: INITIAL   2.  Pt will increase all impaired lumbar A/ROM by 25% without pain.   Baseline:  Goal status: INITIAL   3.  Pt will be independent with use of squatty potty, relaxed toileting mechanics, and improved bowel movement techniques in order to increase ease of bowel movements and complete evacuation.    Baseline:  Goal status: INITIAL   4.  Pt will be independent with the knack, urge suppression technique, and double voiding  in order to improve bladder habits and decrease urinary incontinence.    Baseline:  Goal status: INITIAL     LONG TERM GOALS: Target date: 05/02/22   Pt will be independent with advanced HEP.    Baseline:  Goal status: INITIAL   2.  Pt will demonstrate increase in all impaired hip strength by 1 muscle grades in order to demonstrate improved lumbopelvic support and increase functional ability.    Baseline:  Goal status: INITIAL   3.  Pt will be independent with diaphragmatic breathing and down training activities in order to improve pelvic floor relaxation.   Baseline:  Goal status: INITIAL   4.  Pt will demonstrate normal pelvic floor muscle tone and A/ROM, able to achieve 4/5 strength with contractions and 10 sec endurance, in order to provide appropriate lumbopelvic support in functional activities.    Baseline:  Goal status: INITIAL   5.  Pt will be independent with pain management techniques and HEP to manage discomfort and pelvic instability throughout remainder of pregnancy. Baseline:  Goal status: INITIAL       PLAN:   PT FREQUENCY: 1-2x/week   PT DURATION: 1 week   PLANNED INTERVENTIONS: Therapeutic exercises, Therapeutic activity, Neuromuscular re-education, Balance training, Gait training, Patient/Family education, Self Care, Joint mobilization, Dry Needling, Biofeedback, and Manual therapy   PLAN FOR NEXT SESSION: Manual techniques to decrease pain and tension in Rt hip/low back; progress pelvic stability exercises; mobility activities.    Heather Roberts, PT, DPT01/23/249:45 AM

## 2022-03-07 ENCOUNTER — Ambulatory Visit: Payer: Medicaid Other

## 2022-03-07 DIAGNOSIS — R279 Unspecified lack of coordination: Secondary | ICD-10-CM | POA: Diagnosis not present

## 2022-03-07 DIAGNOSIS — R293 Abnormal posture: Secondary | ICD-10-CM | POA: Diagnosis not present

## 2022-03-07 DIAGNOSIS — M6281 Muscle weakness (generalized): Secondary | ICD-10-CM

## 2022-03-07 DIAGNOSIS — M5459 Other low back pain: Secondary | ICD-10-CM

## 2022-03-07 DIAGNOSIS — M549 Dorsalgia, unspecified: Secondary | ICD-10-CM | POA: Diagnosis not present

## 2022-03-07 DIAGNOSIS — M62838 Other muscle spasm: Secondary | ICD-10-CM

## 2022-03-07 NOTE — Therapy (Addendum)
OUTPATIENT PHYSICAL THERAPY TREATMENT NOTE   Patient Name: Regina Scott MRN: [id number] DOB:1981-01-03, 42 y.o., female Today's Date: 03/07/2022  PCP: NA REFERRING PROVIDER: Lavina Hamman, MD  END OF SESSION:   PT End of Session - 03/07/22 0852     Visit Number 4    Date for PT Re-Evaluation 05/02/22    Authorization Type [employer]    Authorization Time Period 01/24/2022-04/23/2022    Authorization - Visit Number 3    Authorization - Number of Visits 10    PT Start Time 0851    PT Stop Time 0929    PT Time Calculation (min) 38 min    Activity Tolerance Patient tolerated treatment well    Behavior During Therapy [facility] for tasks assessed/performed               Past Medical History:  Diagnosis Date   Constipation    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 2019 with adhesion removal   PRECAUTIONS: Pregnant - due date 04/16/22  SUBJECTIVE:  SUBJECTIVE STATEMENT:  Pt states that she is having much more pain today. MET only work some of the time.    PAIN:  Are you having pain? Yes: NPRS scale: 4/10 Pain location: Rt low back Pain description: pinching Aggravating factors: sporadic Relieving factors: nothing  02/07/22 SUBJECTIVE STATEMENT: Pt is 7 months pregnant; due date is 04/16/22 [name] is son). Primary complaint of low back back pain on Rt and radiating pain into Rt LE in the back. Pain feels like it is pinching in her low back and then difficult to put pressure on Rt side for several minutes.  Fluid intake: Yes: a lot of water     PAIN:  Are you having pain? Yes NPRS scale: 7/10, 10/10 worst Pain location: Right and low back/hip/LE   Pain type: pinching Pain description: intermittent    Aggravating factors: sporadic, but tends to be worse first thing in the morning Relieving factors: nothing   PRECAUTIONS: None   WEIGHT BEARING RESTRICTIONS: No   FALLS:  Has patient fallen in last 6 months? No and Yes. Number of falls 1  Larey Seat out of broken chair onto tailbone.    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 2019 with adhesion removal  Sexual abuse: No   BOWEL MOVEMENT: Pain with bowel movement: No Type of bowel movement:Frequency at least every other day if not every day and Strain Yes Fully empty rectum: Yes: - Leakage: No Pads: No Fiber supplement: No   URINATION: Pain with urination: No Fully empty bladder: No - will leak after going to the bathroom Stream: Strong Urgency: No Frequency: At least every hour Leakage: Coughing, Sneezing, and Laughing Pads: No   INTERCOURSE: Pain with intercourse:  no pain     PREGNANCY: Vaginal deliveries 2 Tearing Yes: with first and did have to have stitches C-section deliveries 0 Currently pregnant Yes: 7 months   PROLAPSE: None     OBJECTIVE:  02/07/22:    PATIENT SURVEYS:    [id number]   COGNITION: Overall cognitive status: Within functional limits for tasks assessed                          SENSATION: Light touch: Appears intact Proprioception: Appears intact   MUSCLE LENGTH:     FUNCTIONAL TESTS: Lt weight shift, anterior weight shift, heel lift Single leg stance: 10 sec bil, less table on Rt and took a minute to get balance     GAIT: Comments: Bil LE circumduction, decreased bil hip extension, increased lumbar lordosis/anterior pelvic tilt   POSTURE: increased lumbar lordosis, decreased thoracic kyphosis, and elevated Rt shoulder, elevated Lt iliac crest , scoliosis, Rt thoracic curve, Lt lumbar curve   LUMBARAROM/PROM:   A/PROM A/PROM  Eval (% available)  Flexion 50  Extension 50  Right lateral flexion 75  Left lateral flexion 75, pull in Rt side  Right rotation 50  Left rotation 50    (Blank rows = not tested)   LOWER EXTREMITY ROM: decreased bil hip extension       LOWER EXTREMITY MMT:   MMT Right eval Left eval  Hip flexion 3  4-  Hip extension      Hip  abduction 3+ 4-  Hip adduction 3+ 4-  Hip internal rotation 4- 4-  Hip external rotation 4-, pain 4-  Knee flexion      Knee extension      Ankle dorsiflexion      Ankle plantarflexion      Ankle inversion      Ankle eversion        PALPATION:   General  tenderness throughout Rt low back/hip; pinpoint tenderness over Rt SIJ; abdominal weakness with 3 finger width distortion upon increased abdominal pressure                 External Perineal Exam NA                             Internal Pelvic Floor NA   Patient confirms identification and approves PT to assess internal pelvic floor and treatment Yes, if needed in future treatment sessions    PELVIC MMT: NA   MMT eval  Vaginal    Internal Anal Sphincter    External Anal Sphincter    Puborectalis    Diastasis Recti    (Blank rows = not tested)         TONE: NA   PROLAPSE: NA   TODAY'S TREATMENT 03/07/22 Manual: Trigger Point Dry-Needling  Treatment instructions: Expect mild to moderate muscle soreness. S/S of pneumothorax if dry needled over a lung field, and to seek immediate medical attention should they occur. Patient verbalized understanding of these instructions and education.  Patient Consent Given: Yes Education handout provided: Yes Muscles treated: Rt glutes Electrical stimulation performed: No Parameters: N/A Treatment response/outcome: significant twitch response Negative pressure soft tissue mobilization to Rt glutes Soft tissue mobilization to Rt glutes Rt posterior innominate mobilization in sidelying - video taken in order for patient's husband to be able to perform at home   TREATMENT 02/27/22 Manual: Muscle energy techniques for correction of pelvic rotation followed by shotgun technique Exercises: Swiss ball exercises: Circles Lateral glides Pelvic tilts D2 10x bil Single arm overhead press 5lb 10x bil horizontal abduction 2 x 10 Therapeutic activities: Squats  with anterior weight hold 10x     TREATMENT 02/14/22:    Manual: Soft tissue mobilization to Rt hip/low back  Exercises: Swiss ball exercises: Circles Lateral glides Pelvic tilts Marching Asymmetrical glide bil Standing table rocks side to side Standing table rocks with one foot propped Seated piriformis stretch 60 sec bil Weight shifting with core activation                                                                                                     PATIENT EDUCATION:  Education details: see above Person educated: Patient Education method: Programmer, multimedia, Facilities manager, Actor cues, Verbal cues, and Handouts Education comprehension: verbalized understanding   HOME EXERCISE PROGRAM: [id number]   ASSESSMENT:   CLINICAL IMPRESSION: Pt still presents with large Rt posterior innominate rotation; she reported  good comfort with posterior mobilization to Rt pelvic and video taken in order for husband to perform at home for pain  relief. Dry needling performed (1x due to pt discomfort) to Rt glutes with significant twitch response and reduced restriction. Followed by manual techniques to help further improved tissue change. Even though she feels like Rt side is too far back, she was educated that it is actually rotated forward so stick with MET that was discussed last week. She will continue to benefit form skilled PT intervention in order to decrease pain, improve functional ability, prepare for delivery, and address impairments.   OBJECTIVE IMPAIRMENTS: decreased activity tolerance, decreased coordination, decreased endurance, decreased strength, increased fascial restrictions, increased muscle spasms, impaired tone, postural dysfunction, and pain.    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 2019 with adhesion removal  are also affecting patient's functional outcome.    REHAB POTENTIAL: Good   CLINICAL DECISION MAKING: Stable/uncomplicated   EVALUATION COMPLEXITY: Low     GOALS: Goals reviewed with patient? Yes   SHORT TERM GOALS: Target date: 03/14/21   Pt will be independent with HEP.    Baseline: Goal status: INITIAL   2.  Pt will increase all impaired lumbar A/ROM by 25% without pain.   Baseline:  Goal status: INITIAL   3.  Pt will be independent with use of squatty potty, relaxed toileting mechanics, and improved bowel movement techniques in order to increase ease of bowel movements and complete evacuation.    Baseline:  Goal status: INITIAL   4.  Pt will be independent with the knack, urge suppression technique, and double voiding in order to improve bladder habits and decrease urinary incontinence.    Baseline:  Goal status: INITIAL     LONG TERM GOALS: Target date: 05/02/22   Pt will be independent with advanced HEP.    Baseline:  Goal status: INITIAL   2.  Pt will demonstrate increase in all impaired hip strength by 1 muscle grades in order to demonstrate improved lumbopelvic support and increase functional ability.    Baseline:  Goal status: INITIAL   3.  Pt will be independent with diaphragmatic breathing and down training activities in order to improve pelvic floor relaxation.   Baseline:  Goal status: INITIAL   4.  Pt will demonstrate normal pelvic floor muscle tone and A/ROM, able to achieve 4/5 strength with contractions and 10 sec endurance, in order to provide appropriate lumbopelvic support in functional activities.    Baseline:  Goal status: INITIAL   5.  Pt will be independent with pain management techniques and HEP to manage discomfort and pelvic instability throughout remainder of pregnancy. Baseline:  Goal status: INITIAL        PLAN:   PT FREQUENCY: 1-2x/week   PT DURATION: 1 week   PLANNED INTERVENTIONS: Therapeutic exercises, Therapeutic activity, Neuromuscular re-education, Balance training, Gait training, Patient/Family education, Self Care, Joint mobilization, Dry Needling, Biofeedback, and Manual therapy   PLAN FOR NEXT SESSION: Manual techniques to decrease pain and tension in Rt hip/low back; progress pelvic stability exercises; mobility activities.    Julio Alm, PT, DPT01/30/249:32 AM  PHYSICAL THERAPY DISCHARGE SUMMARY  Visits from Start of Care: 4  Current functional level related to goals / functional outcomes: Unknown   Remaining deficits: See above   Education / Equipment: HEP   Patient agrees to discharge. Patient goals were partially met. Patient is being discharged due  to not returning since the last visit.  Julio Alm, PT, DPT02/03/253:28 PM

## 2022-03-16 ENCOUNTER — Ambulatory Visit: Payer: Medicaid Other | Attending: Obstetrics and Gynecology

## 2022-03-21 DIAGNOSIS — Z3685 Encounter for antenatal screening for Streptococcus B: Secondary | ICD-10-CM | POA: Diagnosis not present

## 2022-03-21 LAB — OB RESULTS CONSOLE GBS: GBS: NEGATIVE

## 2022-04-10 NOTE — H&P (Signed)
Regina Scott is a 59 [id number] female presenting at 37 1/7wks for IOL. She is dated per LMP which was confirmed with a 10week [place].  Her pregnancy has not been complicated She is GBS neg. 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   w/  suction for missed ab   LAPAROSCOPIC SALPINGO OOPHERECTOMY Left 02/13/2017   Procedure: operative laparoscopy, left salpingo oophorectomy, lysis of adhesions, fulgeration of endometriosis;  Surgeon: Janyth Contes, MD;  Location: [facility];  Service: Gynecology;  Laterality: Left;  MD request RNFA   Family History: family history includes COPD in her father; Cancer in her mother; Diabetes in her maternal grandmother; Heart attack in her maternal grandmother. Social History:  reports that she has never smoked. She has never used smokeless tobacco. She reports that she does not drink alcohol and does not use drugs.     Maternal Diabetes: No Genetic Screening: Declined Maternal Ultrasounds/Referrals: Normal Fetal Ultrasounds or other Referrals:  None Maternal Substance Abuse:  No Significant Maternal Medications:  None Significant Maternal Lab Results:   Group B Strep negative Number of Prenatal Visits:greater than 3 verified prenatal visits Other Comments:  None  Review of Systems  Constitutional:  Positive for activity change and fatigue.  Eyes:  Negative for photophobia and visual disturbance.  Respiratory:  Negative for chest tightness and shortness of breath.   Cardiovascular:  Positive for leg swelling. Negative for chest pain and palpitations.  Gastrointestinal:  Positive for abdominal pain.  Genitourinary:  Positive for pelvic pain. Negative for vaginal pain.  Musculoskeletal:  Positive for back pain.  Neurological:  Negative for dizziness, light-headedness, numbness and headaches.  Psychiatric/Behavioral:  The patient is nervous/anxious.    Maternal Medical History:  Reason for admission: AMA for IOL   Contractions: Frequency: rare.   Perceived severity is moderate.   Fetal activity: Perceived fetal activity is normal.   Prenatal complications: no prenatal complications Prenatal Complications - Diabetes: none.     unknown if currently breastfeeding. Maternal Exam:  Uterine Assessment: Contraction strength is moderate.  Contraction frequency is irregular.  Abdomen: Patient reports generalized tenderness.  Estimated fetal weight is AGA.   Fetal presentation: vertex Introitus: Normal vulva. Vulva is negative for condylomata.  Normal vagina.  Vagina is negative for condylomata.  Pelvis: adequate for delivery.   Cervix: Cervix evaluated by digital exam.     Fetal Exam Fetal Monitor Review: Baseline rate: 143.  Physical Exam Vitals and nursing note reviewed. Exam conducted with a chaperone present.  Constitutional:      Appearance: Normal appearance. She is normal weight.  Cardiovascular:     Rate and Rhythm: Normal rate.     Pulses: Normal pulses.  Pulmonary:     Effort: Pulmonary effort is normal.  Abdominal:     Tenderness: There is generalized abdominal tenderness.  Genitourinary:    General: Normal  vulva.  Musculoskeletal:        General: Normal range of motion.     Cervical back: Normal range of motion.  Skin:    General: Skin is warm and dry.     Capillary Refill: Capillary refill takes 2 to 3 seconds.  Neurological:     General: No focal deficit present.     Mental Status: She is alert and oriented to person, place, and time. Mental status is at baseline.  Psychiatric:        Mood and Affect: Mood normal.        Behavior: Behavior normal.        Thought Content: Thought content normal.        Judgment: Judgment normal.     Prenatal labs: ABO, Rh:   Antibody:   Rubella:   RPR:    HBsAg:    HIV:    GBS:     Assessment/Plan: [id number] female at 30 1/7wks for IOL - Admit - Pit/AROM - Pain control prn pt request - GBS neg - Anticipate svd    Venetia Night Stevie Charter 04/10/2022, 6:50 PM

## 2022-04-11 ENCOUNTER — Inpatient Hospital Stay (HOSPITAL_COMMUNITY): Payer: Medicaid Other

## 2022-04-11 ENCOUNTER — Inpatient Hospital Stay (HOSPITAL_COMMUNITY)
Admission: RE | Admit: 2022-04-11 | Discharge: 2022-04-14 | DRG: 807 | Disposition: A | Discharge status: Home / Self Care | Payer: Medicaid Other | Attending: Obstetrics and Gynecology | Admitting: Obstetrics and Gynecology

## 2022-04-11 ENCOUNTER — Inpatient Hospital Stay (HOSPITAL_COMMUNITY): Payer: Medicaid Other | Admitting: Anesthesiology

## 2022-04-11 DIAGNOSIS — Z3490 Encounter for supervision of normal pregnancy, unspecified, unspecified trimester: Principal | ICD-10-CM

## 2022-04-11 DIAGNOSIS — O081 Delayed or excessive hemorrhage following ectopic and molar pregnancy: Secondary | ICD-10-CM | POA: Diagnosis not present

## 2022-04-11 DIAGNOSIS — O411230 Chorioamnionitis, third trimester, not applicable or unspecified: Principal | ICD-10-CM | POA: Diagnosis present

## 2022-04-11 DIAGNOSIS — Z3A39 39 weeks gestation of pregnancy: Secondary | ICD-10-CM | POA: Diagnosis not present

## 2022-04-11 DIAGNOSIS — Z880 Allergy status to penicillin: Secondary | ICD-10-CM

## 2022-04-11 DIAGNOSIS — O26893 Other specified pregnancy related conditions, third trimester: Secondary | ICD-10-CM | POA: Diagnosis present

## 2022-04-11 DIAGNOSIS — O411290 Chorioamnionitis, unspecified trimester, not applicable or unspecified: Secondary | ICD-10-CM | POA: Diagnosis not present

## 2022-04-11 LAB — CBC
HCT: 35.7 % — ABNORMAL LOW (ref 36.0–46.0)
Hemoglobin: 12.3 g/dL (ref 12.0–15.0)
MCH: 31 pg (ref 26.0–34.0)
MCHC: 34.5 g/dL (ref 30.0–36.0)
MCV: 89.9 fL (ref 80.0–100.0)
Platelets: 252 10*3/uL (ref 150–400)
RBC: 3.97 MIL/uL (ref 3.87–5.11)
RDW: 13.7 % (ref 11.5–15.5)
WBC: 10.1 10*3/uL (ref 4.0–10.5)
nRBC: 0 % (ref 0.0–0.2)

## 2022-04-11 LAB — TYPE AND SCREEN
ABO/RH(D): A POS
Antibody Screen: NEGATIVE

## 2022-04-11 LAB — HIV ANTIBODY (ROUTINE TESTING W REFLEX): HIV Screen 4th Generation wRfx: NONREACTIVE

## 2022-04-11 MED ORDER — LACTATED RINGERS IV SOLN: INTRAVENOUS | Status: DC

## 2022-04-11 MED ORDER — LIDOCAINE HCL (PF) 1 % IJ SOLN
INTRAMUSCULAR | Status: DC | PRN
  Administered 2022-04-11: 2 mL via EPIDURAL
  Administered 2022-04-11: 10 mL via EPIDURAL

## 2022-04-11 MED ORDER — PHENYLEPHRINE 80 MCG/ML (10ML) SYRINGE FOR IV PUSH (FOR BLOOD PRESSURE SUPPORT)
80.0000 ug | PREFILLED_SYRINGE | INTRAVENOUS | Status: DC | PRN
  Filled 2022-04-11: qty 10

## 2022-04-11 MED ORDER — PHENYLEPHRINE 80 MCG/ML (10ML) SYRINGE FOR IV PUSH (FOR BLOOD PRESSURE SUPPORT): 80.0000 ug | PREFILLED_SYRINGE | INTRAVENOUS | Status: DC | PRN

## 2022-04-11 MED ORDER — SOD CITRATE-CITRIC ACID 500-334 MG/5ML PO SOLN
30.0000 mL | ORAL | Status: DC | PRN
  Administered 2022-04-12: 30 mL via ORAL
  Filled 2022-04-11: qty 30

## 2022-04-11 MED ORDER — ONDANSETRON HCL 4 MG/2ML IJ SOLN
4.0000 mg | Freq: Four times a day (QID) | INTRAMUSCULAR | Status: DC | PRN
  Administered 2022-04-12: 4 mg via INTRAVENOUS
  Filled 2022-04-11: qty 2

## 2022-04-11 MED ORDER — TERBUTALINE SULFATE 1 MG/ML IJ SOLN: 0.2500 mg | Freq: Once | INTRAMUSCULAR | Status: DC | PRN

## 2022-04-11 MED ORDER — ACETAMINOPHEN 325 MG PO TABS: 650.0000 mg | ORAL_TABLET | ORAL | Status: DC | PRN

## 2022-04-11 MED ORDER — EPHEDRINE 5 MG/ML INJ: 10.0000 mg | INTRAVENOUS | Status: DC | PRN

## 2022-04-11 MED ORDER — OXYTOCIN-SODIUM CHLORIDE 30-0.9 UT/500ML-% IV SOLN: 2.5000 [IU]/h | INTRAVENOUS | Status: DC

## 2022-04-11 MED ORDER — FENTANYL-BUPIVACAINE-NACL 0.5-0.125-0.9 MG/250ML-% EP SOLN
12.0000 mL/h | EPIDURAL | Status: DC | PRN
  Administered 2022-04-12: 12 mL/h via EPIDURAL
  Filled 2022-04-11 (×2): qty 250

## 2022-04-11 MED ORDER — OXYTOCIN BOLUS FROM INFUSION: 333.0000 mL | Freq: Once | INTRAVENOUS | Status: DC

## 2022-04-11 MED ORDER — OXYTOCIN-SODIUM CHLORIDE 30-0.9 UT/500ML-% IV SOLN
1.0000 m[IU]/min | INTRAVENOUS | Status: DC
  Administered 2022-04-11: 2 m[IU]/min via INTRAVENOUS
  Filled 2022-04-11: qty 500

## 2022-04-11 MED ORDER — LIDOCAINE HCL (PF) 1 % IJ SOLN
30.0000 mL | INTRAMUSCULAR | Status: AC | PRN
  Administered 2022-04-12: 30 mL via SUBCUTANEOUS
  Filled 2022-04-11: qty 30

## 2022-04-11 MED ORDER — LACTATED RINGERS IV SOLN: 500.0000 mL | INTRAVENOUS | Status: DC | PRN

## 2022-04-11 MED ORDER — LACTATED RINGERS IV SOLN
500.0000 mL | Freq: Once | INTRAVENOUS | Status: AC
  Administered 2022-04-11: 500 mL via INTRAVENOUS

## 2022-04-11 MED ORDER — FENTANYL-BUPIVACAINE-NACL 0.5-0.125-0.9 MG/250ML-% EP SOLN
EPIDURAL | Status: DC | PRN
  Administered 2022-04-11: 12 mL/h via EPIDURAL

## 2022-04-11 MED ORDER — FENTANYL CITRATE (PF) 100 MCG/2ML IJ SOLN: 50.0000 ug | INTRAMUSCULAR | Status: DC | PRN

## 2022-04-11 MED ORDER — DIPHENHYDRAMINE HCL 50 MG/ML IJ SOLN: 12.5000 mg | INTRAMUSCULAR | Status: DC | PRN

## 2022-04-11 NOTE — Anesthesia Preprocedure Evaluation (Signed)
Anesthesia Evaluation  Patient identified by MRN, date of birth, ID band Patient awake    Reviewed: Allergy & Precautions, Patient's Chart, lab work & pertinent test results  Airway Mallampati: II  TM Distance: >3 FB Neck ROM: Full    Dental no notable dental hx.    Pulmonary neg pulmonary ROS   Pulmonary exam normal breath sounds clear to auscultation       Cardiovascular negative cardio ROS Normal cardiovascular exam Rhythm:Regular Rate:Normal     Neuro/Psych negative neurological ROS  negative psych ROS   GI/Hepatic negative GI ROS, Neg liver ROS,,,  Endo/Other  negative endocrine ROS    Renal/GU negative Renal ROS  negative genitourinary   Musculoskeletal negative musculoskeletal ROS (+)    Abdominal   Peds negative pediatric ROS (+)  Hematology negative hematology ROS (+) Hb 12.3, plt 252   Anesthesia Other Findings   Reproductive/Obstetrics (+) Pregnancy                             Anesthesia Physical Anesthesia Plan  ASA: 2  Anesthesia Plan: Epidural   Post-op Pain Management:    Induction:   PONV Risk Score and Plan: 2  Airway Management Planned: Natural Airway  Additional Equipment: None  Intra-op Plan:   Post-operative Plan:   Informed Consent: I have reviewed the patients History and Physical, chart, labs and discussed the procedure including the risks, benefits and alternatives for the proposed anesthesia with the patient or authorized representative who has indicated his/her understanding and acceptance.       Plan Discussed with:   Anesthesia Plan Comments:        Anesthesia Quick Evaluation

## 2022-04-11 NOTE — Anesthesia Procedure Notes (Signed)
Epidural Patient location during procedure: OB Start time: 04/11/2022 8:26 PM End time: 04/11/2022 8:36 PM  Staffing Anesthesiologist: Pervis Hocking, DO Performed: anesthesiologist   Preanesthetic Checklist Completed: patient identified, IV checked, risks and benefits discussed, monitors and equipment checked, pre-op evaluation and timeout performed  Epidural Patient position: sitting Prep: DuraPrep and site prepped and draped Patient monitoring: continuous pulse ox, blood pressure, heart rate and cardiac monitor Approach: midline Location: L3-L4 Injection technique: LOR air  Needle:  Needle type: Tuohy  Needle gauge: 17 G Needle length: 9 cm Needle insertion depth: 5 cm Catheter type: closed end flexible Catheter size: 19 Gauge Catheter at skin depth: 10 cm Test dose: negative  Assessment Sensory level: T8 Events: blood not aspirated, no cerebrospinal fluid, injection not painful, no injection resistance, no paresthesia and negative IV test  Additional Notes Patient identified. Risks/Benefits/Options discussed with patient including but not limited to bleeding, infection, nerve damage, paralysis, failed block, incomplete pain control, headache, blood pressure changes, nausea, vomiting, reactions to medication both or allergic, itching and postpartum back pain. Confirmed with bedside nurse the patient's most recent platelet count. Confirmed with patient that they are not currently taking any anticoagulation, have any bleeding history or any family history of bleeding disorders. Patient expressed understanding and wished to proceed. All questions were answered. Sterile technique was used throughout the entire procedure. Please see nursing notes for vital signs. Test dose was given through epidural catheter and negative prior to continuing to dose epidural or start infusion. Warning signs of high block given to the patient including shortness of breath, tingling/numbness in  hands, complete motor block, or any concerning symptoms with instructions to call for help. Patient was given instructions on fall risk and not to get out of bed. All questions and concerns addressed with instructions to call with any issues or inadequate analgesia.  Reason for block:procedure for pain

## 2022-04-11 NOTE — Progress Notes (Addendum)
Regina Scott is a 42 y.o. [id number] at  admitted for induction of labor due to AMA at term.  Objective: BP 129/81 (BP Location: Right Arm)   Pulse (!) 101   Resp 17  No intake/output data recorded. No intake/output data recorded.  FHT:  FHR: 150 bpm, variability: moderate,  accelerations:  Present,  decelerations:  Absent UC:   irregular, every 1-5 minutes SVE:    2-3/60/-3  Labs: Lab Results  Component Value Date   WBC 10.1 04/11/2022   HGB 12.3 04/11/2022   HCT 35.7 (L) 04/11/2022   MCV 89.9 04/11/2022   PLT 252 04/11/2022    Assessment / Plan: [id number] [id number] female here for IOL  - AROM done - clear fluid noted - Augment with pitocin if indicated - Desires epidural - may get prn  - Anticipate svd   Cathrine Muster, DO 04/11/2022, 12:11 PM

## 2022-04-12 ENCOUNTER — Encounter (HOSPITAL_COMMUNITY): Payer: Self-pay | Admitting: Obstetrics and Gynecology

## 2022-04-12 LAB — RPR: RPR Ser Ql: NONREACTIVE

## 2022-04-12 MED ORDER — ACETAMINOPHEN 500 MG PO TABS
1000.0000 mg | ORAL_TABLET | Freq: Once | ORAL | Status: AC
  Administered 2022-04-12: 1000 mg via ORAL
  Filled 2022-04-12: qty 2

## 2022-04-12 MED ORDER — TETANUS-DIPHTH-ACELL PERTUSSIS 5-2.5-18.5 LF-MCG/0.5 IM SUSY: 0.5000 mL | PREFILLED_SYRINGE | Freq: Once | INTRAMUSCULAR | Status: DC

## 2022-04-12 MED ORDER — DIBUCAINE (PERIANAL) 1 % EX OINT: 1.0000 | TOPICAL_OINTMENT | CUTANEOUS | Status: DC | PRN

## 2022-04-12 MED ORDER — IBUPROFEN 600 MG PO TABS
600.0000 mg | ORAL_TABLET | Freq: Four times a day (QID) | ORAL | Status: DC
  Administered 2022-04-12 – 2022-04-14 (×8): 600 mg via ORAL
  Filled 2022-04-12 (×8): qty 1

## 2022-04-12 MED ORDER — DOCUSATE SODIUM 100 MG PO CAPS
100.0000 mg | ORAL_CAPSULE | Freq: Two times a day (BID) | ORAL | Status: DC
  Administered 2022-04-13 (×2): 100 mg via ORAL
  Filled 2022-04-12 (×2): qty 1

## 2022-04-12 MED ORDER — TRANEXAMIC ACID-NACL 1000-0.7 MG/100ML-% IV SOLN
INTRAVENOUS | Status: AC
  Administered 2022-04-12: 1000 mg
  Filled 2022-04-12: qty 100

## 2022-04-12 MED ORDER — ONDANSETRON HCL 4 MG/2ML IJ SOLN: 4.0000 mg | INTRAMUSCULAR | Status: DC | PRN

## 2022-04-12 MED ORDER — OXYCODONE HCL 5 MG PO TABS: 5.0000 mg | ORAL_TABLET | ORAL | Status: DC | PRN

## 2022-04-12 MED ORDER — ONDANSETRON HCL 4 MG PO TABS: 4.0000 mg | ORAL_TABLET | ORAL | Status: DC | PRN

## 2022-04-12 MED ORDER — BISACODYL 10 MG RE SUPP: 10.0000 mg | Freq: Every day | RECTAL | Status: DC | PRN

## 2022-04-12 MED ORDER — DIPHENHYDRAMINE HCL 50 MG/ML IJ SOLN
25.0000 mg | Freq: Once | INTRAMUSCULAR | Status: AC
  Administered 2022-04-12: 25 mg via INTRAVENOUS
  Filled 2022-04-12: qty 1

## 2022-04-12 MED ORDER — BENZOCAINE-MENTHOL 20-0.5 % EX AERO: 1.0000 | INHALATION_SPRAY | CUTANEOUS | Status: DC | PRN

## 2022-04-12 MED ORDER — SIMETHICONE 80 MG PO CHEW: 80.0000 mg | CHEWABLE_TABLET | ORAL | Status: DC | PRN

## 2022-04-12 MED ORDER — WITCH HAZEL-GLYCERIN EX PADS: 1.0000 | MEDICATED_PAD | CUTANEOUS | Status: DC | PRN

## 2022-04-12 MED ORDER — CLINDAMYCIN PHOSPHATE 900 MG/50ML IV SOLN
900.0000 mg | Freq: Three times a day (TID) | INTRAVENOUS | Status: DC
  Administered 2022-04-12 (×2): 900 mg via INTRAVENOUS
  Filled 2022-04-12 (×2): qty 50

## 2022-04-12 MED ORDER — DIPHENHYDRAMINE HCL 25 MG PO CAPS: 25.0000 mg | ORAL_CAPSULE | Freq: Four times a day (QID) | ORAL | Status: DC | PRN

## 2022-04-12 MED ORDER — LACTATED RINGERS AMNIOINFUSION: INTRAVENOUS | Status: DC

## 2022-04-12 MED ORDER — FLEET ENEMA 7-19 GM/118ML RE ENEM: 1.0000 | ENEMA | Freq: Every day | RECTAL | Status: DC | PRN

## 2022-04-12 MED ORDER — GENTAMICIN SULFATE 40 MG/ML IJ SOLN
5.0000 mg/kg | INTRAVENOUS | Status: DC
  Administered 2022-04-12: 390 mg via INTRAVENOUS
  Filled 2022-04-12 (×2): qty 9.75

## 2022-04-12 MED ORDER — ACETAMINOPHEN 325 MG PO TABS
650.0000 mg | ORAL_TABLET | ORAL | Status: DC | PRN
  Administered 2022-04-12 – 2022-04-13 (×2): 650 mg via ORAL
  Filled 2022-04-12 (×2): qty 2

## 2022-04-12 MED ORDER — OXYCODONE HCL 5 MG PO TABS: 10.0000 mg | ORAL_TABLET | ORAL | Status: DC | PRN

## 2022-04-12 MED ORDER — CALCIUM CARBONATE ANTACID 500 MG PO CHEW
2.0000 | CHEWABLE_TABLET | ORAL | Status: DC | PRN
  Administered 2022-04-12: 400 mg via ORAL
  Filled 2022-04-12: qty 2

## 2022-04-12 MED ORDER — COCONUT OIL OIL
1.0000 | TOPICAL_OIL | Status: DC | PRN
  Administered 2022-04-13: 1 via TOPICAL

## 2022-04-12 MED ORDER — PRENATAL MULTIVITAMIN CH
1.0000 | ORAL_TABLET | Freq: Every day | ORAL | Status: DC
  Administered 2022-04-13: 1 via ORAL
  Filled 2022-04-12 (×2): qty 1

## 2022-04-12 NOTE — Progress Notes (Signed)
Changed out telemetry monitors.  Adjusted monitors and rezeroed IUPC.

## 2022-04-12 NOTE — Plan of Care (Signed)

## 2022-04-12 NOTE — Progress Notes (Signed)
Patient ID: Regina Scott, female   DOB: 06/28/1980, 43 y.o.   MRN: [id number] Pt started on gentamicin and clindamycin for chorioamnionitis  Continue with expectant mgmt

## 2022-04-12 NOTE — Progress Notes (Signed)
Patient ID: Regina Scott, female   DOB: [birth year], 42 y.o.   MRN: [id number] Called in to patients room   Pt reports feeling "crappy" . She describes having the shakes, feeling cold and having a HA. She also reports increased pelvic pain with contractions  Epidural catheter noted in place. Had pt push the button   BP stable. Denies visual changes.   Temp noted at 101.8. Rec 553m LR bolus and tylenol '1000mg'$  now. If not improved over next 373ms then start on antibiotics  SVE noted 8cm/90/-1 station. More lip on right. Pt repositioned with peanut ball to effect further cervical change. Progressing well on pitocin  EFM 150, minimal variability, no decels or accels.  TOCO - ctxs q 29m34m

## 2022-04-12 NOTE — Progress Notes (Signed)
Patient ID: Regina Scott, female   DOB: Jun 13, 1980, 42 y.o.   MRN: [id number] Pt sleeping after receiving benadryl. No complaints. Shakes have resolved. +FMs VSS GEN - sleepy but easily arousable SVE - swelling on cervix  and labia reduced            8.5/90/-2 MVUs 160 with pit at 72ms  TOCO - ctxs q 340ms   A/P: [id number] in labor on pitocin      - Pitocin was increased to 1872mand shortly afterwards pt had a deceleration ( late) into the 70s for 2 mins.  Pitocin was stopped and position changed with improvement in strip noted No further decels noted after 27m92mhence recommend restarting pitocin at 12mu23mill [facility]

## 2022-04-12 NOTE — Lactation Note (Signed)
This note was copied from a baby's chart. Lactation Consultation Note  Patient Name: Regina Scott [id number] Date: 04/12/2022 Age:40 hours Reason for consult: Initial assessment;Term  P3, term female infant, Per Birth Parent, this is infant's 2nd time latching at the breast, Birth Parent latched infant on her left breast using the football hold position, infant was on and off breast , was still breastfeeding after 13 minutes when LC left the room. Birth Parent will continue to work towards latching infant at the breast, BF infant according to hunger cues, on demand, 8 to 12+ times within 24 hours, STS. Birth Parent knows to ask RN/LC for further latch assistance if needed. LC discussed importance of maternal rest, diet and hydration with Birth Parent. LC discussed infant's input and output. Birth Parent was made aware of O/P services, breastfeeding support groups, community resources, and our phone # for post-discharge questions.    Maternal Data Per Birth Parent, she breastfeed her 52st and 2nd child for 6 months each, her 2nd child is currently 95 years old.   Feeding Mother's Current Feeding Choice: Breast Milk  LATCH Score Latch: Repeated attempts needed to sustain latch, nipple held in mouth throughout feeding, stimulation needed to elicit sucking reflex.  Audible Swallowing: A few with stimulation  Type of Nipple: Everted at rest and after stimulation  Comfort (Breast/Nipple): Soft / non-tender  Hold (Positioning): Assistance needed to correctly position infant at breast and maintain latch.  LATCH Score: 7   Lactation Tools Discussed/Used    Interventions Interventions: Breast feeding basics reviewed;Adjust position;Support pillows;Assisted with latch;Skin to skin;Position options;Breast compression;Breast massage;Education;LC Services brochure  Discharge Pump: DEBP (Per Birth Parent, she has DEBP at home.)  Consult Status Consult Status: Follow-up Date:  04/13/22 Follow-up type: In-patient    Eulis Canner 04/12/2022, 8:33 PM

## 2022-04-12 NOTE — Progress Notes (Signed)
OB Progress Note  S:Feeling intermittent rectal pressure   O: Today's Vitals   04/12/22 0930 04/12/22 1000 04/12/22 1030 04/12/22 1100  BP: 116/70 118/70  122/70  Pulse: (!) 102 90  91  Resp: '16 16  15  '$ Temp:  98.6 F (37 C) 98.9 F (37.2 C)   TempSrc:  Oral Oral   SpO2:      Weight:      Height:      PainSc:    Asleep   Body mass index is 33.85 kg/m.  SVE 9.5/100/0 to +1, IUPC in place  FHR: 130bpm, mod variability,  + accels, no decels Toco: ctx q 3-4 mins, MVUs 140   A/P: [id number] @ [redacted]w[redacted]d labor Fetal wellbeing: cat I tracing Labor: protracted labor overnight, MVUs were not adequate, now increasing pitocin (currently at 266mmin) after short break this AM and exam significantly progressed. Cervix now fully effaced with thin rim remaining. Anticipate SVD Pain control: epidural  M. MaBrien MatesMD 04/12/22 11:58 AM

## 2022-04-13 LAB — CBC
HCT: 32.5 % — ABNORMAL LOW (ref 36.0–46.0)
Hemoglobin: 10.3 g/dL — ABNORMAL LOW (ref 12.0–15.0)
MCH: 29.9 pg (ref 26.0–34.0)
MCHC: 31.7 g/dL (ref 30.0–36.0)
MCV: 94.2 fL (ref 80.0–100.0)
Platelets: 210 10*3/uL (ref 150–400)
RBC: 3.45 MIL/uL — ABNORMAL LOW (ref 3.87–5.11)
RDW: 14.3 % (ref 11.5–15.5)
WBC: 22.5 10*3/uL — ABNORMAL HIGH (ref 4.0–10.5)
nRBC: 0 % (ref 0.0–0.2)

## 2022-04-13 NOTE — Lactation Note (Signed)
This note was copied from a baby's chart. Lactation Consultation Note  Patient Name: Girl Mercedees Stockholm [id number] Date: 04/13/2022 Age:40 hours  Attempted to see mom. Mom and FOB in bed holding baby watching TV. Asked mom how BF going mom stated pretty good. FOB stated he thought she was having trouble latching to one breast and mom stated not all of the time. Asked when the next feeding was and LC could try to come back for assistance. Asked mom to call for [facility] for next feeding. Mom stated OK.  Maternal Data    Feeding    LATCH Score                    Lactation Tools Discussed/Used    Interventions    Discharge    Consult Status      Theodoro Kalata 04/13/2022, 10:11 PM

## 2022-04-13 NOTE — Progress Notes (Signed)
POSTPARTUM PROGRESS NOTE  Post Partum Day #1  Subjective:  No acute events overnight.  Pt denies problems with ambulating, voiding or po intake.  She denies nausea or vomiting.  Pain is well controlled. Lochia Minimal.   Objective: Blood pressure 117/68, pulse 84, temperature (!) 97.5 F (36.4 C), temperature source Oral, resp. rate 17, height '5\' 8"'$  (1.727 m), weight 101 kg, SpO2 98 %, unknown if currently breastfeeding.  Physical Exam:  General: alert, cooperative and no distress Lochia:normal flow Chest: CTAB Heart: RRR no m/r/g Abdomen: +BS, soft, nontender Uterine Fundus: firm, 2cm below umbilicus GU: suture intact, healing well, no purulent drainage, mild edema Extremities: neg edema, neg calf TTP BL, neg Homans BL  Recent Labs    04/11/22 1118 04/13/22 0455  HGB 12.3 10.3*  HCT 35.7* 32.5*    Assessment/Plan:  ASSESSMENT: Regina Scott is a 42 y.o. [id number] s/p SVD @ [redacted]w[redacted]d PNC c/b AMA. Intrapartum course s/f Triple I    Plan for discharge tomorrow and Breastfeeding Triple I - s/p 24hrs clinda/gent (PCN allergy) at 0300. Benign exam. Lat temp 102.83F @ 0304 04/12/22. Leukocytosis 22.5 this AM c/w labor course. Repeat tomorrow, continue to monitor exam and VS   LOS: 2 days

## 2022-04-13 NOTE — Anesthesia Postprocedure Evaluation (Signed)
Anesthesia Post Note  Patient: Regina Scott  Procedure(s) Performed: AN AD In[place]     Patient location during evaluation: Mother Baby Anesthesia Type: Epidural Level of consciousness: awake Pain management: satisfactory to patient Vital Signs Assessment: post-procedure vital signs reviewed and stable Respiratory status: spontaneous breathing Cardiovascular status: stable Anesthetic complications: no  No notable events documented.  Last Vitals:  Vitals:   04/13/22 0320 04/13/22 0728  BP: 128/82 117/68  Pulse: (!) 106 84  Resp: 18 17  Temp: 36.9 C (!) 36.4 C  SpO2: 98% 98%    Last Pain:  Vitals:   04/13/22 0849  TempSrc:   PainSc: 7    Pain Goal: Patients Stated Pain Goal: 0 (04/11/22 2348)                 Casimer Lanius

## 2022-04-14 LAB — CBC WITH DIFFERENTIAL/PLATELET
Abs Immature Granulocytes: 0.11 10*3/uL — ABNORMAL HIGH (ref 0.00–0.07)
Basophils Absolute: 0.1 10*3/uL (ref 0.0–0.1)
Basophils Relative: 0 %
Eosinophils Absolute: 0.4 10*3/uL (ref 0.0–0.5)
Eosinophils Relative: 3 %
HCT: 29.7 % — ABNORMAL LOW (ref 36.0–46.0)
Hemoglobin: 9.9 g/dL — ABNORMAL LOW (ref 12.0–15.0)
Immature Granulocytes: 1 %
Lymphocytes Relative: 17 %
Lymphs Abs: 2.4 10*3/uL (ref 0.7–4.0)
MCH: 30.6 pg (ref 26.0–34.0)
MCHC: 33.3 g/dL (ref 30.0–36.0)
MCV: 91.7 fL (ref 80.0–100.0)
Monocytes Absolute: 1.1 10*3/uL — ABNORMAL HIGH (ref 0.1–1.0)
Monocytes Relative: 7 %
Neutro Abs: 10.5 10*3/uL — ABNORMAL HIGH (ref 1.7–7.7)
Neutrophils Relative %: 72 %
Platelets: 225 10*3/uL (ref 150–400)
RBC: 3.24 MIL/uL — ABNORMAL LOW (ref 3.87–5.11)
RDW: 14.2 % (ref 11.5–15.5)
WBC: 14.5 10*3/uL — ABNORMAL HIGH (ref 4.0–10.5)
nRBC: 0 % (ref 0.0–0.2)

## 2022-04-14 MED ORDER — IBUPROFEN 600 MG PO TABS: 600.0000 mg | ORAL_TABLET | Freq: Four times a day (QID) | ORAL | 0 refills | Status: AC

## 2022-04-14 NOTE — Progress Notes (Signed)
PPD #2 Feeling ok Afeb, VSS Fundus firm, NT D/c home

## 2022-04-14 NOTE — Discharge Instructions (Signed)
As per discharge pamphlet °

## 2022-04-14 NOTE — Discharge Summary (Signed)
Postpartum Discharge Summary      Patient Name: Regina Scott DOB: 1980/07/27 MRN: [id number]  Date of admission: 04/11/2022 Delivery date:04/12/2022  Delivering provider: Irene Pap E  Date of discharge: 04/14/2022  Admitting diagnosis: Term pregnancy [Z34.90] Intrauterine pregnancy: [redacted]w[redacted]d    Secondary diagnosis:  Principal Problem:   Term pregnancy    Discharge diagnosis: Term Pregnancy Delivered and AMA, suspected chorioamnionitis                                                Hospital course: Induction of Labor With Vaginal Delivery   42y.o. yo [id number] admitted to the hospital 04/11/2022 for induction of labor.  Indication for induction: AMA.  Patient had an labor course complicated by protracted labor and fever, received antibiotics for probable chorioamnionitis Membrane Rupture Time/Date: 12:10 PM ,04/11/2022   Delivery Method:Vaginal, Spontaneous  Episiotomy: None  Lacerations:  1st degree;Perineal  Details of delivery can be found in separate delivery note.  Patient had a postpartum course complicated by continued antibiotics for probable chorioamnionitis, but remained afebrile. Patient is discharged home 04/14/22.  Newborn Data: Birth date:04/12/2022  Birth time:3:33 PM  Gender:Female  Living status:Living  Apgars:8 ,9  Weight:3670 g   Physical exam  Vitals:   04/13/22 0728 04/13/22 1403 04/13/22 2029 04/14/22 0517  BP: 117/68 139/85 131/79 128/76  Pulse: 84 94 89 93  Resp: '17 18 18 18  '$ Temp: (!) 97.5 F (36.4 C) 98.1 F (36.7 C) 98.4 F (36.9 C) 98.6 F (37 C)  TempSrc: Oral Oral Oral Oral  SpO2: 98% 98% 98% 98%  Weight:      Height:       General: alert Lochia: appropriate Uterine Fundus: firm  Labs: Lab Results  Component Value Date   WBC 14.5 (H) 04/14/2022   HGB 9.9 (L) 04/14/2022   HCT 29.7 (L) 04/14/2022   MCV 91.7 04/14/2022   PLT 225 04/14/2022      Latest Ref Rng & Units 02/09/2017    4:25 PM  CMP  Glucose 65 - 99  mg/dL 96   BUN 6 - 20 mg/dL 12   Creatinine 0.44 - 1.00 mg/dL 0.63   Sodium 135 - 145 mmol/L 137   Potassium 3.5 - 5.1 mmol/L 3.8   Chloride 101 - 111 mmol/L 104   CO2 22 - 32 mmol/L 26   Calcium 8.9 - 10.3 mg/dL 9.4   Total Protein 6.5 - 8.1 g/dL 7.8   Total Bilirubin 0.3 - 1.2 mg/dL 1.5   Alkaline Phos 38 - 126 U/L 57   AST 15 - 41 U/L 13   ALT 14 - 54 U/L 11    Edinburgh Score:    04/12/2022    5:52 PM  Edinburgh Postnatal Depression Scale Screening Tool  I have been able to laugh and see the funny side of things. 0  I have looked forward with enjoyment to things. 0  I have blamed myself unnecessarily when things went wrong. 1  I have been anxious or worried for no good reason. 0  I have felt scared or panicky for no good reason. 0  Things have been getting on top of me. 0  I have been so unhappy that I have had difficulty sleeping. 0  I have felt sad or miserable. 0  I have been  so unhappy that I have been crying. 0  The thought of harming myself has occurred to me. 0  Edinburgh Postnatal Depression Scale Total 1      After visit meds:  Allergies as of 04/14/2022       Reactions   Penicillins Rash        Medication List     TAKE these medications    ibuprofen 600 MG tablet Commonly known as: ADVIL Take 1 tablet (600 mg total) by mouth every 6 (six) hours.   PRENATAL PO Take 1 tablet by mouth daily.         Discharge home in stable condition Infant Feeding: Breast Infant Disposition:home with mother Discharge instruction: per After Visit Summary and Postpartum booklet. Activity: Advance as tolerated. Pelvic rest for 6 weeks.  Diet: routine diet Postpartum Appointment:6 weeks Follow up Visit:  Edgemere, [place], DO. Schedule an appointment as soon as possible for a visit in 6 week(s).   Specialty: Obstetrics and Gynecology Contact information: [address] [place] [address] [phone]                      04/14/2022 Clarene Duke, MD

## 2022-04-17 LAB — SURGICAL PATHOLOGY

## 2022-04-24 ENCOUNTER — Telehealth (HOSPITAL_COMMUNITY): Payer: Self-pay | Admitting: *Deleted

## 2022-04-24 NOTE — Telephone Encounter (Signed)
Patient stated, "I think I have a UTI, but I have an appointment for tomorrow with my doctor." Patient voiced no questions or concerns regarding her health at this time. Patient voiced no questions or concerns regarding infant at this time. Patient reports infant sleeps in a bassinet on her back. RN reviewed ABCs of safe sleep. Patient verbalized understanding. EPDS not completed at this time. Patient stated, "We're in the grocery store right now." Patient requested call back tomorrow. Erline Levine, RN, 04/24/22, [phone]

## 2022-04-25 ENCOUNTER — Telehealth (HOSPITAL_COMMUNITY): Payer: Self-pay

## 2022-04-25 DIAGNOSIS — M7918 Myalgia, other site: Secondary | ICD-10-CM | POA: Diagnosis not present

## 2022-04-25 DIAGNOSIS — R8291 Other chromoabnormalities of urine: Secondary | ICD-10-CM | POA: Diagnosis not present

## 2022-04-25 DIAGNOSIS — R309 Painful micturition, unspecified: Secondary | ICD-10-CM | POA: Diagnosis not present

## 2022-04-25 NOTE — Telephone Encounter (Signed)
RN calling patient back to follow up from yesterday's call and complete EPDS with patient. Patient states that she just did an EPDS with her doctor today. Patient declines wanting to do EPDS. Patient states that she is doing well and feeling good emotionally.  Samantha Crimes 04/25/22,1251

## 2022-05-24 DIAGNOSIS — Z1151 Encounter for screening for human papillomavirus (HPV): Secondary | ICD-10-CM | POA: Diagnosis not present

## 2022-05-24 DIAGNOSIS — R87810 Cervical high risk human papillomavirus (HPV) DNA test positive: Secondary | ICD-10-CM | POA: Diagnosis not present

## 2022-05-24 DIAGNOSIS — Z124 Encounter for screening for malignant neoplasm of cervix: Secondary | ICD-10-CM | POA: Diagnosis not present

## 2022-10-11 DIAGNOSIS — M25532 Pain in left wrist: Secondary | ICD-10-CM | POA: Diagnosis not present

## 2022-10-11 DIAGNOSIS — Z3200 Encounter for pregnancy test, result unknown: Secondary | ICD-10-CM | POA: Diagnosis not present

## 2022-10-11 DIAGNOSIS — Z3202 Encounter for pregnancy test, result negative: Secondary | ICD-10-CM | POA: Diagnosis not present

## 2022-10-11 DIAGNOSIS — M25642 Stiffness of left hand, not elsewhere classified: Secondary | ICD-10-CM | POA: Diagnosis not present

## 2022-10-11 DIAGNOSIS — M79642 Pain in left hand: Secondary | ICD-10-CM | POA: Diagnosis not present

## 2022-10-20 DIAGNOSIS — M654 Radial styloid tenosynovitis [de Quervain]: Secondary | ICD-10-CM | POA: Diagnosis not present

## 2022-10-20 DIAGNOSIS — M79642 Pain in left hand: Secondary | ICD-10-CM | POA: Diagnosis not present

## 2022-10-31 DIAGNOSIS — N925 Other specified irregular menstruation: Secondary | ICD-10-CM | POA: Diagnosis not present

## 2022-10-31 DIAGNOSIS — N912 Amenorrhea, unspecified: Secondary | ICD-10-CM | POA: Diagnosis not present

## 2022-11-20 DIAGNOSIS — N925 Other specified irregular menstruation: Secondary | ICD-10-CM | POA: Diagnosis not present

## 2022-11-28 DIAGNOSIS — Z130 Encounter for screening for diseases of the blood and blood-forming organs and certain disorders involving the immune mechanism: Secondary | ICD-10-CM | POA: Diagnosis not present

## 2022-11-28 DIAGNOSIS — Z1389 Encounter for screening for other disorder: Secondary | ICD-10-CM | POA: Diagnosis not present

## 2022-11-28 DIAGNOSIS — Z1231 Encounter for screening mammogram for malignant neoplasm of breast: Secondary | ICD-10-CM | POA: Diagnosis not present

## 2022-11-28 DIAGNOSIS — Z01411 Encounter for gynecological examination (general) (routine) with abnormal findings: Secondary | ICD-10-CM | POA: Diagnosis not present

## 2022-11-28 DIAGNOSIS — N926 Irregular menstruation, unspecified: Secondary | ICD-10-CM | POA: Diagnosis not present

## 2022-11-28 DIAGNOSIS — Z3169 Encounter for other general counseling and advice on procreation: Secondary | ICD-10-CM | POA: Diagnosis not present

## 2023-04-16 ENCOUNTER — Emergency Department (HOSPITAL_BASED_OUTPATIENT_CLINIC_OR_DEPARTMENT_OTHER)

## 2023-04-16 ENCOUNTER — Emergency Department (HOSPITAL_COMMUNITY)
Admission: EM | Admit: 2023-04-16 | Discharge: 2023-04-16 | Disposition: A | Discharge status: Home / Self Care | Attending: Emergency Medicine | Admitting: Emergency Medicine

## 2023-04-16 ENCOUNTER — Encounter (HOSPITAL_BASED_OUTPATIENT_CLINIC_OR_DEPARTMENT_OTHER): Payer: Self-pay | Admitting: Emergency Medicine

## 2023-04-16 ENCOUNTER — Emergency Department (HOSPITAL_BASED_OUTPATIENT_CLINIC_OR_DEPARTMENT_OTHER)
Admission: EM | Admit: 2023-04-16 | Discharge: 2023-04-17 | Disposition: A | Discharge status: Home / Self Care | Source: Home / Self Care | Attending: Emergency Medicine | Admitting: Emergency Medicine

## 2023-04-16 ENCOUNTER — Other Ambulatory Visit: Payer: Self-pay

## 2023-04-16 DIAGNOSIS — R1031 Right lower quadrant pain: Secondary | ICD-10-CM | POA: Diagnosis not present

## 2023-04-16 DIAGNOSIS — N9489 Other specified conditions associated with female genital organs and menstrual cycle: Secondary | ICD-10-CM | POA: Insufficient documentation

## 2023-04-16 DIAGNOSIS — D259 Leiomyoma of uterus, unspecified: Secondary | ICD-10-CM | POA: Diagnosis not present

## 2023-04-16 DIAGNOSIS — K5730 Diverticulosis of large intestine without perforation or abscess without bleeding: Secondary | ICD-10-CM | POA: Diagnosis not present

## 2023-04-16 DIAGNOSIS — R109 Unspecified abdominal pain: Secondary | ICD-10-CM | POA: Diagnosis not present

## 2023-04-16 DIAGNOSIS — N939 Abnormal uterine and vaginal bleeding, unspecified: Secondary | ICD-10-CM | POA: Diagnosis not present

## 2023-04-16 DIAGNOSIS — Z5321 Procedure and treatment not carried out due to patient leaving prior to being seen by health care provider: Secondary | ICD-10-CM | POA: Diagnosis not present

## 2023-04-16 DIAGNOSIS — K8020 Calculus of gallbladder without cholecystitis without obstruction: Secondary | ICD-10-CM | POA: Diagnosis not present

## 2023-04-16 DIAGNOSIS — K7689 Other specified diseases of liver: Secondary | ICD-10-CM | POA: Diagnosis not present

## 2023-04-16 DIAGNOSIS — N854 Malposition of uterus: Secondary | ICD-10-CM | POA: Diagnosis not present

## 2023-04-16 LAB — COMPREHENSIVE METABOLIC PANEL
ALT: 11 U/L (ref 0–44)
AST: 11 U/L — ABNORMAL LOW (ref 15–41)
Albumin: 4.6 g/dL (ref 3.5–5.0)
Alkaline Phosphatase: 58 U/L (ref 38–126)
Anion gap: 9 (ref 5–15)
BUN: 15 mg/dL (ref 6–20)
CO2: 26 mmol/L (ref 22–32)
Calcium: 9.5 mg/dL (ref 8.9–10.3)
Chloride: 103 mmol/L (ref 98–111)
Creatinine, Ser: 0.94 mg/dL (ref 0.44–1.00)
GFR, Estimated: >60 mL/min (ref >60)
Glucose, Bld: 93 mg/dL (ref 70–99)
Potassium: 3.6 mmol/L (ref 3.5–5.1)
Sodium: 138 mmol/L (ref 135–145)
Total Bilirubin: 0.9 mg/dL (ref 0.0–1.2)
Total Protein: 7 g/dL (ref 6.5–8.1)

## 2023-04-16 LAB — CBC
HCT: 41.9 % (ref 36.0–46.0)
Hemoglobin: 13.8 g/dL (ref 12.0–15.0)
MCH: 30.2 pg (ref 26.0–34.0)
MCHC: 32.9 g/dL (ref 30.0–36.0)
MCV: 91.7 fL (ref 80.0–100.0)
Platelets: 382 10*3/uL (ref 150–400)
RBC: 4.57 MIL/uL (ref 3.87–5.11)
RDW: 13.4 % (ref 11.5–15.5)
WBC: 8.2 10*3/uL (ref 4.0–10.5)
nRBC: 0 % (ref 0.0–0.2)

## 2023-04-16 LAB — PREGNANCY, URINE: Preg Test, Ur: NEGATIVE

## 2023-04-16 MED ORDER — IOHEXOL 300 MG/ML  SOLN
100.0000 mL | Freq: Once | INTRAMUSCULAR | Status: AC | PRN
  Administered 2023-04-16: 100 mL via INTRAVENOUS

## 2023-04-16 NOTE — ED Triage Notes (Signed)
 Patient c/o RLQ pain x 1 week.  Patient seen at [facility] and reports worsening pain with palpation and then vaginal bleeding that started when she got home.  Patient sent here for [place].

## 2023-04-16 NOTE — ED Provider Notes (Signed)
  Physical Exam  BP 130/89   Pulse 68   Temp 97.8 F (36.6 C) (Oral)   Resp 18   Wt 86.2 kg   LMP 03/27/2023 (Approximate)   SpO2 100%   BMI 28.89 kg/m   Physical Exam  Procedures  Procedures  ED Course / MDM    Medical Decision Making Amount and/or Complexity of Data Reviewed Labs: ordered. Radiology: ordered.  Risk Prescription drug management.   73F presenting with RLQ tenderness. Waiting on a CT. Torsion study negative.  CT results: IMPRESSION:  1. Normal appendix.  2. Prominent right periuterine vascularity and dilatation of the  ovarian vein. This can be seen with pelvic congestion syndrome in  the appropriate clinical setting.  3. Cholelithiasis without cholecystitis.  4. Uterine fibroid better demonstrated on ultrasound earlier today.   Patient advised NSAIDs for pain control, outpatient gynecology follow-up.     Ernie Avena, MD 04/17/23 Rich Fuchs

## 2023-04-16 NOTE — ED Notes (Signed)
 Patient stated they did not want to wait longer.

## 2023-04-16 NOTE — ED Provider Notes (Signed)
 [facility] EMERGENCY DEPARTMENT AT [facility] Provider Note   CSN: [id number] Arrival date & time: 04/16/23  1819     History {Add pertinent medical, surgical, social history, OB history to HPI:1} Chief Complaint  Patient presents with   Abdominal Pain    Regina Scott is a 43 y.o. female.  HPI    43 year old female comes in with chief complaint of abdominal pain Home Medications Prior to Admission medications   Medication Sig Start Date End Date Taking? Authorizing Provider  ibuprofen (ADVIL) 600 MG tablet Take 1 tablet (600 mg total) by mouth every 6 (six) hours. 04/14/22   Meisinger, Tawanna Cooler, MD  Prenatal Vit-Fe Fumarate-FA (PRENATAL PO) Take 1 tablet by mouth daily.    [provider]      Allergies    Penicillins    Review of Systems   Review of Systems  Physical Exam Updated Vital Signs BP 118/84   Pulse 80   Temp 97.8 F (36.6 C) (Oral)   Resp 18   Wt 86.2 kg   LMP 03/27/2023 (Approximate)   SpO2 98%   BMI 28.89 kg/m  Physical Exam  ED Results / Procedures / Treatments   Labs (all labs ordered are listed, but only abnormal results are displayed) Labs Reviewed  COMPREHENSIVE METABOLIC PANEL - Abnormal; Notable for the following components:      Result Value   AST 11 (*)    All other components within normal limits  PREGNANCY, URINE  CBC    EKG None  Radiology US PELVIC TRANSABD W/PELVIC DOPPLER Result Date: 04/16/2023 CLINICAL DATA:  Right lower quadrant pain, vaginal bleeding. History of left salpingectomy due to endometriosis in 2019. LMP 03/27/2023 EXAM: TRANSABDOMINAL ULTRASOUND OF PELVIS DOPPLER ULTRASOUND OF OVARIES TECHNIQUE: Transabdominal ultrasound examination of the pelvis was performed including evaluation of the uterus, ovaries, adnexal regions, and pelvic cul-de-sac. Color and duplex Doppler ultrasound was utilized to evaluate blood flow to the ovaries. COMPARISON:  None Available. FINDINGS: Uterus Measurements: 9.6  x 5.2 x 5.6 cm = volume: 146 mL. The uterus appears anteverted but retroflexed. The cervix is not optimally visualized on this examination but is grossly unremarkable. A hypoechoic intramural solid mass is seen within the posterior fundus measuring 1.6 x 1.5 x 2.0 cm most in keeping with an intramural fibroid. Endometrium Thickness: 10 mm.  No focal abnormality visualized. Right ovary Measurements: 3.2 x 1.4 x 3.1 cm = volume: 7 mL. Normal appearance/no adnexal mass. Left ovary Absent.  No adnexal mass identified. Pulsed Doppler evaluation demonstrates normal low-resistance arterial and venous waveforms in the residual right ovary. Other: No free fluid within the pelvis IMPRESSION: 1. No acute abnormality identified. 2. 2.0 cm intramural fibroid within the posterior fundus. 3. Left oophorectomy. Electronically Signed   By: Helyn Numbers M.D.   On: 04/16/2023 22:12    Procedures Procedures  {Document cardiac monitor, telemetry assessment procedure when appropriate:1}  Medications Ordered in ED Medications  iohexol (OMNIPAQUE) 300 MG/ML solution 100 mL (100 mLs Intravenous Contrast Given 04/16/23 2304)    ED Course/ Medical Decision Making/ A&P   {   Click here for ABCD2, HEART and other calculatorsREFRESH Note before signing :1}                              Medical Decision Making Amount and/or Complexity of Data Reviewed Labs: ordered. Radiology: ordered.  Risk Prescription drug management.   ***  {Document critical care time  when appropriate:1} {Document review of labs and clinical decision tools ie heart score, Chads2Vasc2 etc:1}  {Document your independent review of radiology images, and any outside records:1} {Document your discussion with family members, caretakers, and with consultants:1} {Document social determinants of health affecting pt's care:1} {Document your decision making why or why not admission, treatments were needed:1} Final Clinical Impression(s) / ED  Diagnoses Final diagnoses:  None    Rx / DC Orders ED Discharge Orders     None

## 2023-04-16 NOTE — ED Notes (Signed)
 ED Provider at bedside.

## 2023-04-17 NOTE — Discharge Instructions (Addendum)
 Your ultrasound and CT imaging was overall reassuring.  It did show evidence of uterine fibroids and some evidence of what is called pelvic congestion syndrome.  Recommend NSAIDs for pain control, follow-up with a gynecologist. CT Results: IMPRESSION:  1. Normal appendix.  2. Prominent right periuterine vascularity and dilatation of the  ovarian vein. This can be seen with pelvic congestion syndrome in  the appropriate clinical setting.  3. Cholelithiasis without cholecystitis.  4. Uterine fibroid better demonstrated on ultrasound earlier today.

## 2023-05-03 DIAGNOSIS — R102 Pelvic and perineal pain: Secondary | ICD-10-CM | POA: Diagnosis not present

## 2023-05-03 DIAGNOSIS — D251 Intramural leiomyoma of uterus: Secondary | ICD-10-CM | POA: Diagnosis not present

## 2023-05-03 DIAGNOSIS — Z3169 Encounter for other general counseling and advice on procreation: Secondary | ICD-10-CM | POA: Diagnosis not present

## 2023-05-13 DIAGNOSIS — K8020 Calculus of gallbladder without cholecystitis without obstruction: Secondary | ICD-10-CM | POA: Diagnosis not present

## 2023-05-13 DIAGNOSIS — R109 Unspecified abdominal pain: Secondary | ICD-10-CM | POA: Diagnosis not present

## 2023-05-13 DIAGNOSIS — R935 Abnormal findings on diagnostic imaging of other abdominal regions, including retroperitoneum: Secondary | ICD-10-CM | POA: Diagnosis not present

## 2023-05-13 DIAGNOSIS — K7689 Other specified diseases of liver: Secondary | ICD-10-CM | POA: Diagnosis not present

## 2023-05-13 DIAGNOSIS — Z87891 Personal history of nicotine dependence: Secondary | ICD-10-CM | POA: Diagnosis not present

## 2023-05-13 DIAGNOSIS — R519 Headache, unspecified: Secondary | ICD-10-CM | POA: Diagnosis not present

## 2023-05-13 DIAGNOSIS — R1032 Left lower quadrant pain: Secondary | ICD-10-CM | POA: Diagnosis not present

## 2023-05-18 DIAGNOSIS — R350 Frequency of micturition: Secondary | ICD-10-CM | POA: Diagnosis not present

## 2023-05-18 DIAGNOSIS — K8020 Calculus of gallbladder without cholecystitis without obstruction: Secondary | ICD-10-CM | POA: Diagnosis not present

## 2023-05-18 DIAGNOSIS — R8290 Unspecified abnormal findings in urine: Secondary | ICD-10-CM | POA: Diagnosis not present

## 2023-05-18 DIAGNOSIS — R3915 Urgency of urination: Secondary | ICD-10-CM | POA: Diagnosis not present

## 2023-05-23 DIAGNOSIS — Z0000 Encounter for general adult medical examination without abnormal findings: Secondary | ICD-10-CM | POA: Diagnosis not present

## 2023-08-05 DIAGNOSIS — N3001 Acute cystitis with hematuria: Secondary | ICD-10-CM | POA: Diagnosis not present

## 2023-09-10 DIAGNOSIS — N83201 Unspecified ovarian cyst, right side: Secondary | ICD-10-CM | POA: Diagnosis not present

## 2023-09-10 DIAGNOSIS — Z880 Allergy status to penicillin: Secondary | ICD-10-CM | POA: Diagnosis not present

## 2023-09-10 DIAGNOSIS — K760 Fatty (change of) liver, not elsewhere classified: Secondary | ICD-10-CM | POA: Diagnosis not present

## 2023-09-10 DIAGNOSIS — K7689 Other specified diseases of liver: Secondary | ICD-10-CM | POA: Diagnosis not present

## 2023-09-10 DIAGNOSIS — K8020 Calculus of gallbladder without cholecystitis without obstruction: Secondary | ICD-10-CM | POA: Diagnosis not present

## 2023-09-11 DIAGNOSIS — R1011 Right upper quadrant pain: Secondary | ICD-10-CM | POA: Diagnosis not present

## 2023-09-11 DIAGNOSIS — K760 Fatty (change of) liver, not elsewhere classified: Secondary | ICD-10-CM | POA: Diagnosis not present

## 2023-09-11 DIAGNOSIS — K8020 Calculus of gallbladder without cholecystitis without obstruction: Secondary | ICD-10-CM | POA: Diagnosis not present

## 2023-09-11 DIAGNOSIS — R1012 Left upper quadrant pain: Secondary | ICD-10-CM | POA: Diagnosis not present

## 2023-09-12 DIAGNOSIS — K8020 Calculus of gallbladder without cholecystitis without obstruction: Secondary | ICD-10-CM | POA: Diagnosis not present

## 2023-09-12 DIAGNOSIS — K8071 Calculus of gallbladder and bile duct without cholecystitis with obstruction: Secondary | ICD-10-CM | POA: Diagnosis not present

## 2023-09-26 DIAGNOSIS — K8020 Calculus of gallbladder without cholecystitis without obstruction: Secondary | ICD-10-CM | POA: Diagnosis not present

## 2023-09-27 DIAGNOSIS — K8020 Calculus of gallbladder without cholecystitis without obstruction: Secondary | ICD-10-CM | POA: Diagnosis not present

## 2023-10-01 DIAGNOSIS — K5900 Constipation, unspecified: Secondary | ICD-10-CM | POA: Diagnosis not present

## 2023-12-06 DIAGNOSIS — O09519 Supervision of elderly primigravida, unspecified trimester: Secondary | ICD-10-CM | POA: Diagnosis not present

## 2023-12-06 DIAGNOSIS — O09521 Supervision of elderly multigravida, first trimester: Secondary | ICD-10-CM | POA: Diagnosis not present

## 2023-12-06 DIAGNOSIS — Z369 Encounter for antenatal screening, unspecified: Secondary | ICD-10-CM | POA: Diagnosis not present

## 2023-12-06 DIAGNOSIS — Z3A08 8 weeks gestation of pregnancy: Secondary | ICD-10-CM | POA: Diagnosis not present

## 2023-12-06 DIAGNOSIS — O26891 Other specified pregnancy related conditions, first trimester: Secondary | ICD-10-CM | POA: Diagnosis not present

## 2023-12-06 DIAGNOSIS — Z113 Encounter for screening for infections with a predominantly sexual mode of transmission: Secondary | ICD-10-CM | POA: Diagnosis not present

## 2023-12-06 DIAGNOSIS — Z1331 Encounter for screening for depression: Secondary | ICD-10-CM | POA: Diagnosis not present

## 2023-12-06 DIAGNOSIS — R87810 Cervical high risk human papillomavirus (HPV) DNA test positive: Secondary | ICD-10-CM | POA: Diagnosis not present

## 2023-12-06 DIAGNOSIS — O30041 Twin pregnancy, dichorionic/diamniotic, first trimester: Secondary | ICD-10-CM | POA: Diagnosis not present

## 2023-12-12 LAB — HM PAP SMEAR: HPV, high-risk: NEGATIVE

## 2024-01-01 DIAGNOSIS — M549 Dorsalgia, unspecified: Secondary | ICD-10-CM | POA: Diagnosis not present

## 2024-01-01 DIAGNOSIS — Z3A12 12 weeks gestation of pregnancy: Secondary | ICD-10-CM | POA: Diagnosis not present

## 2024-01-01 DIAGNOSIS — R110 Nausea: Secondary | ICD-10-CM | POA: Diagnosis not present

## 2024-01-01 DIAGNOSIS — O09519 Supervision of elderly primigravida, unspecified trimester: Secondary | ICD-10-CM | POA: Diagnosis not present

## 2024-01-01 DIAGNOSIS — O09521 Supervision of elderly multigravida, first trimester: Secondary | ICD-10-CM | POA: Diagnosis not present

## 2024-01-01 DIAGNOSIS — O99891 Other specified diseases and conditions complicating pregnancy: Secondary | ICD-10-CM | POA: Diagnosis not present

## 2024-01-01 DIAGNOSIS — O30041 Twin pregnancy, dichorionic/diamniotic, first trimester: Secondary | ICD-10-CM | POA: Diagnosis not present

## 2024-01-08 ENCOUNTER — Ambulatory Visit: Attending: Obstetrics and Gynecology

## 2024-01-08 DIAGNOSIS — M5459 Other low back pain: Secondary | ICD-10-CM | POA: Insufficient documentation

## 2024-01-08 DIAGNOSIS — R262 Difficulty in walking, not elsewhere classified: Secondary | ICD-10-CM | POA: Insufficient documentation

## 2024-01-08 DIAGNOSIS — R252 Cramp and spasm: Secondary | ICD-10-CM | POA: Diagnosis present

## 2024-01-08 DIAGNOSIS — R293 Abnormal posture: Secondary | ICD-10-CM | POA: Diagnosis present

## 2024-01-08 DIAGNOSIS — M6281 Muscle weakness (generalized): Secondary | ICD-10-CM | POA: Insufficient documentation

## 2024-01-08 NOTE — Therapy (Signed)
 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   Ovarian cyst, complex 02/09/2017   Pelvic pain 02/09/2017   Postpartum care following vaginal delivery (5/16) 06/21/2013   SVD (spontaneous vaginal delivery) 06/21/2013    PCP: No PCP  REFERRING PROVIDER: Delana Ted Morrison, DO  REFERRING DIAG: [phone] (ICD-10-CM) - Other specified diseases and conditions  complicating pregnancy M54.9 (ICD-10-CM) - Dorsalgia, unspecified  Rationale for Evaluation and Treatment: Rehabilitation  THERAPY DIAG:  Muscle weakness (generalized) - Plan: PT plan of care cert/re-cert  Cramp and spasm - Plan: PT plan of care cert/re-cert  Abnormal posture - Plan: PT plan of care cert/re-cert  Difficulty in walking, not elsewhere classified - Plan: PT plan of care cert/re-cert  ONSET DATE: 01/02/2024  SUBJECTIVE:                                                                                                                                                                                           SUBJECTIVE STATEMENT: Patient is [redacted]  weeks gestation with twins.  This will be her 5th and 6th.  43 y.o. female who was seen today for physical therapy evaluation and treatment for low back pain with pregnancy.   She explains that she had low back pain with her other pregnancies.  She describes the pain as severe and mostly on the left side and hip but also having some pain on the right.  She is currently not working.  She has 4 other children.  PERTINENT HISTORY:  Long hx of low back pain  PAIN:  Are you having pain? Yes: NPRS scale: 9/10 Pain location: Left low back and bilateral hip Pain description: sharp, severe Aggravating factors: walking Relieving factors: rest ice heat  PRECAUTIONS: Other: pregnancy  RED FLAGS: None   WEIGHT BEARING RESTRICTIONS: No  FALLS:  Has patient fallen in last 6 months? No  LIVING ENVIRONMENT: Lives with: lives with their family Lives in: House/apartment   OCCUPATION: na  PLOF: Independent, Independent with basic ADLs, Independent with household mobility without device, Independent with community mobility without device, Independent with homemaking with ambulation, Independent with gait, and Independent with transfers  PATIENT GOALS: to be able to move around without severe pain  NEXT MD VISIT: prn  OBJECTIVE:  Note:  Objective measures were completed at Evaluation unless otherwise noted.  DIAGNOSTIC FINDINGS:  na  PATIENT SURVEYS:  Complete next visit  COGNITION: Overall cognitive status: Within functional limits for tasks assessed     SENSATION: Questionable radicular symptoms left LE : were not present today  MUSCLE LENGTH: Hamstrings: Right 50 deg; Left 45 deg Thomas test: Right pos ; Left pos   POSTURE: rounded shoulders, increased lumbar lordosis, and flexed trunk   PALPATION: na  LUMBAR ROM:   AROM eval  Flexion Fingertips to top of patella  Extension 25%  Right lateral flexion Fingertips to joint line  Left lateral flexion Fingertips to joint line  Right rotation 50%  Left rotation 50%   (Blank rows = not tested)  LOWER EXTREMITY ROM:     WFL  LOWER EXTREMITY MMT:    Deferred due to pain level  LUMBAR SPECIAL TESTS:  Straight leg raise test: Positive  FUNCTIONAL TESTS:  5 times sit to stand: complete next visit Timed up and go (TUG): complete next visigt  GAIT: Distance walked: 30 feet Assistive device utilized: None Level of assistance: Mod A Comments: spouse helped her up out of chair and assisted her to walk to treatment room  TREATMENT DATE: 01/08/24 Initial eval completed Initiated HEP but not charged due to insurance restrictions                                                                                                                                 PATIENT EDUCATION:  Education details: Initiated HEP but not charged due to insurance restrictions Person educated: Patient and Spouse Education method: Programmer, Multimedia, Facilities Manager, Verbal cues, and Handouts Education comprehension: verbalized understanding  and returned demonstration  HOME EXERCISE PROGRAM: Access Code: [facility] URL: [email or web address] Date: 01/08/2024 Prepared by: Delon Haddock  Exercises - Supine Hamstring Stretch with Strap  - 1 x daily - 7 x weekly - 3  sets - 10 reps - Hooklying Transversus Abdominis Palpation  - 1 x daily - 7 x weekly - 3 sets - 10 reps - Supine Lower Trunk Rotation  - 1 x daily - 7 x weekly - 2 sets - 10 reps  ASSESSMENT:  CLINICAL IMPRESSION: Patient is a 43 y.o. female who was seen today for physical therapy evaluation and treatment for low back pain with pregnancy.  She is [redacted] weeks gestation.  She explains that she had low back pain with her other pregnancies.  She describes the pain as severe and mostly on the left side and hip but also having some pain on the right.  She is currently not working.  She has 4 other children.   She presents with decreased lumbar ROM, strength and function along with severe pain.  She is only taking muscle relaxer and using ice and heat.  She does not want to use Tylenol .  She would benefit from LE stretching, core strengthening and pain control.  Also suggested walker to allow her to move about independently and off load the spine.    OBJECTIVE IMPAIRMENTS: Abnormal gait, decreased mobility, difficulty walking, decreased ROM, decreased strength, increased fascial restrictions, increased muscle spasms, impaired flexibility, improper body mechanics, postural dysfunction, and pain.   ACTIVITY LIMITATIONS: carrying, lifting, bending, standing, squatting, sleeping, stairs, transfers, bed mobility, bathing, toileting, dressing, hygiene/grooming, and caring for others  PARTICIPATION LIMITATIONS: meal prep, cleaning, laundry, driving, shopping, community activity, occupation, and yard work  PERSONAL FACTORS: Fitness, Past/current experiences, and 1 comorbidity: pregnancy are also affecting patient's functional outcome.   REHAB POTENTIAL: Good  CLINICAL DECISION MAKING: Stable/uncomplicated  EVALUATION COMPLEXITY: Low   GOALS: Goals reviewed with patient? Yes  SHORT TERM GOALS: Target date: 02/05/2024  Pain report to be no greater than 4/10  Baseline: Goal status: INITIAL  2.  Patient  will be independent with initial HEP  Baseline:  Goal status: INITIAL   LONG TERM GOALS: Target date: 03/04/2024  Patient to report pain no greater than 2/10  Baseline:  Goal status: INITIAL  2.  Patient to be independent with advanced HEP  Baseline:  Goal status: INITIAL  3.  Patient to be able to ambulate safely and independently without an a.d. Baseline:  Goal status: INITIAL  4.  Patient to be able to care for her children independently Baseline:  Goal status: INITIAL  5.  Patient to be able to sleep through the night  Baseline:  Goal status: INITIAL  6.  Patient to report 85% improvement in overall symptoms  Baseline:  Goal status: INITIAL  PLAN:  PT FREQUENCY: 1-2x/week  PT DURATION: 8 weeks  PLANNED INTERVENTIONS: 97110-Therapeutic exercises, 97530- Therapeutic activity, 97112- Neuromuscular re-education, 97535- Self Care, 02859- Manual therapy, [phone]- Gait training, [phone]- Aquatic Therapy, [phone] (1-2 muscles), [phone] (3+ muscles)- Dry Needling, Patient/Family education, Balance training, Stair training, Taping, Joint mobilization, Spinal mobilization, DME instructions, Cryotherapy, and Moist heat.  PLAN FOR NEXT SESSION: Nustep, review HEP, begin core strengthening   Regina Scott, PT 01/08/24 1:20 PM [facility] [address] [place], [place] [address] Phone # [phone] Fax [phone]

## 2024-01-09 ENCOUNTER — Ambulatory Visit: Admitting: Physical Therapy

## 2024-01-09 ENCOUNTER — Encounter: Payer: Self-pay | Admitting: Physical Therapy

## 2024-01-09 DIAGNOSIS — R252 Cramp and spasm: Secondary | ICD-10-CM

## 2024-01-09 DIAGNOSIS — R262 Difficulty in walking, not elsewhere classified: Secondary | ICD-10-CM

## 2024-01-09 DIAGNOSIS — M6281 Muscle weakness (generalized): Secondary | ICD-10-CM

## 2024-01-09 DIAGNOSIS — O09521 Supervision of elderly multigravida, first trimester: Secondary | ICD-10-CM | POA: Diagnosis not present

## 2024-01-09 DIAGNOSIS — R293 Abnormal posture: Secondary | ICD-10-CM

## 2024-01-09 NOTE — Therapy (Signed)
 OUTPATIENT PHYSICAL THERAPY THORACOLUMBAR TREATMENT   Patient Name: Regina Scott MRN: [id number] DOB:10/19/1980, 43 y.o., female Today's Date: 01/09/2024  END OF SESSION:  PT End of Session - 01/09/24 1543     Visit Number 2    Date for Recertification  03/04/24    Authorization Type [employer] Approved 5 vl 01/08/2024-03/07/24    Authorization - Visit Number 1    Authorization - Number of Visits 5    PT Start Time 1454    PT Stop Time 1532    PT Time Calculation (min) 38 min    Activity Tolerance Patient tolerated treatment well    Behavior During Therapy [facility] for tasks assessed/performed           Past Medical History:  Diagnosis Date   Constipation    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   Ovarian cyst, complex 02/09/2017   Pelvic pain 02/09/2017   Postpartum care following vaginal delivery (5/16) 06/21/2013   SVD (spontaneous vaginal delivery) 06/21/2013    PCP: No  PCP  REFERRING PROVIDER: Delana Ted Morrison, DO  REFERRING DIAG: [phone] (ICD-10-CM) - Other specified diseases and conditions complicating pregnancy M54.9 (ICD-10-CM) - Dorsalgia, unspecified  Rationale for Evaluation and Treatment: Rehabilitation  THERAPY DIAG:  Muscle weakness (generalized)  Cramp and spasm  Abnormal posture  Difficulty in walking, not elsewhere classified  ONSET DATE: 01/02/2024  SUBJECTIVE:                                                                                                                                                                                           SUBJECTIVE STATEMENT: Patient reports 7/10  low back pain currently. She was sore whiles doing HEP exercises but she felt better afterwards  From Eval: Patient is [redacted] weeks gestation with twins.  This will be her 5th and 6th.  43 y.o. female who was seen today for physical therapy evaluation and treatment for low back pain with pregnancy.   She explains that she had low back pain with her other pregnancies.  She describes the pain as severe and mostly on the left side and hip but also having some pain on the right.  She is currently not working.  She has 4 other children.  PERTINENT HISTORY:  Long hx of low back pain She got an eipdural with all pregnancies   PAIN:  Are you having pain? Yes: NPRS scale: 9/10 Pain location: Left low back and bilateral hip Pain description: sharp, severe Aggravating factors: walking Relieving factors: rest ice heat  PRECAUTIONS: Other: pregnancy  RED FLAGS: None   WEIGHT BEARING RESTRICTIONS: No  FALLS:  Has patient fallen in last 6 months? No  LIVING ENVIRONMENT: Lives with: lives with their family Lives in: House/apartment   OCCUPATION: na  PLOF: Independent, Independent with basic ADLs, Independent with household mobility without device, Independent with community mobility without device, Independent with homemaking with ambulation,  Independent with gait, and Independent with transfers  PATIENT GOALS: to be able to move around without severe pain  NEXT MD VISIT: prn  OBJECTIVE:  Note: Objective measures were completed at Evaluation unless otherwise noted.  DIAGNOSTIC FINDINGS:  na  PATIENT SURVEYS:  Complete next visit  COGNITION: Overall cognitive status: Within functional limits for tasks assessed     SENSATION: Questionable radicular symptoms left LE : were not present today  MUSCLE LENGTH: Hamstrings: Right 50 deg; Left 45 deg Thomas test: Right pos ; Left pos   POSTURE: rounded shoulders, increased lumbar lordosis, and flexed trunk   PALPATION: na  LUMBAR ROM:   AROM eval  Flexion Fingertips to top of patella  Extension 25%  Right lateral flexion Fingertips to joint line  Left lateral flexion Fingertips to joint line  Right rotation 50%  Left rotation 50%   (Blank rows = not tested)  LOWER EXTREMITY ROM:     WFL  LOWER EXTREMITY MMT:    Deferred due to pain level  LUMBAR SPECIAL TESTS:  Straight leg raise test: Positive  FUNCTIONAL TESTS:  5 times sit to stand: complete next visit Timed up and go (TUG): complete next visigt  GAIT: Distance walked: 30 feet Assistive device utilized: None Level of assistance: Mod A Comments: spouse helped her up out of chair and assisted her to walk to treatment room  TREATMENT DATE:  01/09/2024 NuStep Level 3 3 mins- PT present to discuss status Supine hamstring stretch 2 x 20 sec bilateral  Supine LTR x 10 bilateral 5 each hold TA activation 2 x 10 Hooklying TA activation + ball squeeze x 20 [place] TA activation + hip abduction with yellow loop x 20  Cat cow x 10 Quadruped slow rock x 5 each side Peanut ball in between legs pelvic rocks x 10 bilateral  Sitting on blue stability ball : pelvic tilts & circles x 20 each   01/08/24 Initial eval completed Initiated HEP but not charged due to insurance restrictions  PATIENT EDUCATION:  Education details: Initiated HEP but not charged due to insurance restrictions Person educated: Patient and Spouse Education method: Programmer, Multimedia, Facilities Manager, Verbal cues, and Handouts Education comprehension: verbalized understanding and returned demonstration  HOME EXERCISE PROGRAM: Access Code: [facility] URL: [email or web address] Date: 01/09/2024 Prepared by: Kristeen Sar  Exercises - Supine Hamstring Stretch with Strap  - 1 x daily - 7 x weekly - 3 sets - 10 reps - Hooklying Transversus Abdominis Palpation  - 1 x daily - 7 x weekly - 3 sets - 10 reps - Supine Lower Trunk Rotation  - 1 x daily - 7 x weekly - 2 sets - 10 reps - Cat Cow  - 1 x daily - 7 x weekly - 3 sets - 10 reps - Quadruped Rocking Slow  - 1 x daily - 7 x weekly - 3 sets - 10 reps - Pelvic Tilt on Swiss Ball  - 1 x daily - 7 x weekly - 2 sets - 10 reps - Seated Lateral Pelvic Tilt on Swiss Ball  - 1 x daily - 7 x weekly - 2 sets - 10 reps  ASSESSMENT:  CLINICAL IMPRESSION: Dreanna presents to first follow up appointment since evaluation. She verbalized compliance to HEP. Supine LTR has been the most helpful for her. Educated patient on the use of TA activation and logroll technique for bed mobility. Incorporated TA activation exercises and PT provided verbal cues for correct activation and breathing technique. At the start of the session she verbalized 7/10 pain and at the end she verbalized feeling the best I have felt all week.  Patient demonstrates good rehab potential to achieve stated goals through skilled therapy intervention.     OBJECTIVE IMPAIRMENTS: Abnormal gait, decreased mobility, difficulty walking, decreased ROM, decreased strength, increased fascial restrictions, increased muscle spasms, impaired flexibility, improper body mechanics, postural dysfunction, and  pain.   ACTIVITY LIMITATIONS: carrying, lifting, bending, standing, squatting, sleeping, stairs, transfers, bed mobility, bathing, toileting, dressing, hygiene/grooming, and caring for others  PARTICIPATION LIMITATIONS: meal prep, cleaning, laundry, driving, shopping, community activity, occupation, and yard work  PERSONAL FACTORS: Fitness, Past/current experiences, and 1 comorbidity: pregnancy are also affecting patient's functional outcome.   REHAB POTENTIAL: Good  CLINICAL DECISION MAKING: Stable/uncomplicated  EVALUATION COMPLEXITY: Low   GOALS: Goals reviewed with patient? Yes  SHORT TERM GOALS: Target date: 02/05/2024  Pain report to be no greater than 4/10  Baseline: Goal status: INITIAL  2.  Patient will be independent with initial HEP  Baseline:  Goal status: INITIAL   LONG TERM GOALS: Target date: 03/04/2024  Patient to report pain no greater than 2/10  Baseline:  Goal status: INITIAL  2.  Patient to be independent with advanced HEP  Baseline:  Goal status: INITIAL  3.  Patient to be able to ambulate safely and independently without an a.d. Baseline:  Goal status: INITIAL  4.  Patient to be able to care for her children independently Baseline:  Goal status: INITIAL  5.  Patient to be able to sleep through the night  Baseline:  Goal status: INITIAL  6.  Patient to report 85% improvement in overall symptoms  Baseline:  Goal status: INITIAL  PLAN:  PT FREQUENCY: 1-2x/week  PT DURATION: 8 weeks  PLANNED INTERVENTIONS: 97110-Therapeutic exercises, 97530- Therapeutic activity, 97112- Neuromuscular re-education, 97535- Self Care, 02859- Manual therapy, [phone]- Gait training, [phone]- Aquatic Therapy, [phone] (1-2 muscles), 20561 (3+ muscles)- Dry Needling, Patient/Family education, Balance training, Stair training, Taping, Joint mobilization, Spinal mobilization, DME instructions, Cryotherapy,  and Moist heat.  PLAN FOR NEXT SESSION: assess pain levels;  continue core strengthening and lumbar mobility   Kristeen Sar, PT, DPT 01/09/24 3:45 PM [facility] [address] [place], [place] [address] Phone # [phone] Fax [phone]

## 2024-01-14 ENCOUNTER — Ambulatory Visit: Admitting: Physical Therapy

## 2024-01-15 ENCOUNTER — Other Ambulatory Visit: Payer: Self-pay | Admitting: Obstetrics and Gynecology

## 2024-01-15 DIAGNOSIS — Z3A12 12 weeks gestation of pregnancy: Secondary | ICD-10-CM

## 2024-01-15 DIAGNOSIS — O30041 Twin pregnancy, dichorionic/diamniotic, first trimester: Secondary | ICD-10-CM

## 2024-01-17 ENCOUNTER — Ambulatory Visit: Admitting: Physical Therapy

## 2024-01-17 ENCOUNTER — Encounter: Payer: Self-pay | Admitting: Physical Therapy

## 2024-01-17 DIAGNOSIS — M6281 Muscle weakness (generalized): Secondary | ICD-10-CM | POA: Diagnosis not present

## 2024-01-17 DIAGNOSIS — R293 Abnormal posture: Secondary | ICD-10-CM

## 2024-01-17 DIAGNOSIS — R252 Cramp and spasm: Secondary | ICD-10-CM

## 2024-01-17 NOTE — Therapy (Signed)
 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   Ovarian cyst, complex 02/09/2017   Pelvic pain 02/09/2017   Postpartum care following vaginal delivery (5/16) 06/21/2013   SVD (spontaneous vaginal delivery) 06/21/2013    PCP: No PCP  REFERRING PROVIDER: Delana Ted Morrison,  DO  REFERRING DIAG: [phone] (ICD-10-CM) - Other specified diseases and conditions complicating pregnancy M54.9 (ICD-10-CM) - Dorsalgia, unspecified  Rationale for Evaluation and Treatment: Rehabilitation  THERAPY DIAG:  Muscle weakness (generalized)  Cramp and spasm  Abnormal posture  ONSET DATE: 01/02/2024  SUBJECTIVE:                                                                                                                                                                                           SUBJECTIVE STATEMENT: Patient reports she is feeling much better.  I do the stretches all day long.  I have some pain  with sit to stand and have to stand there a few minutes.    From Eval: Patient is [redacted] weeks gestation with twins.  This will be her 5th and 6th.  43 y.o. female who was seen today for physical therapy evaluation and treatment for low back pain with pregnancy.   She explains that she had low back pain with her other pregnancies.  She describes the pain as severe and mostly on the left side and hip but also having some pain on the right.  She is currently not working.  She has 4 other children.  PERTINENT HISTORY:  Long hx of low back pain She got an eipdural with all pregnancies   PAIN:  Are you having pain? Yes: NPRS scale: 1/10 Pain location: Left low back and bilateral hip Pain description: sharp, severe Aggravating factors: walking Relieving factors: rest ice heat  PRECAUTIONS: Other: pregnancy  RED FLAGS: None   WEIGHT BEARING RESTRICTIONS: No  FALLS:  Has patient fallen in last 6 months? No  LIVING ENVIRONMENT: Lives with: lives with their family Lives in: House/apartment   OCCUPATION: na  PLOF: Independent, Independent with basic ADLs, Independent with household mobility without device, Independent with community mobility without device, Independent with homemaking with ambulation, Independent with gait, and Independent with transfers  PATIENT  GOALS: to be able to move around without severe pain  NEXT MD VISIT: prn  OBJECTIVE:  Note: Objective measures were completed at Evaluation unless otherwise noted.  DIAGNOSTIC FINDINGS:  na  PATIENT SURVEYS:  Complete next visit  COGNITION: Overall cognitive status: Within functional limits for tasks assessed     SENSATION: Questionable radicular symptoms left LE : were not present today  MUSCLE LENGTH: Hamstrings: Right 50 deg; Left 45 deg Thomas test: Right pos ; Left pos   POSTURE: rounded shoulders, increased lumbar lordosis, and flexed trunk   PALPATION: na  LUMBAR ROM:   AROM eval  Flexion Fingertips to top of patella  Extension 25%  Right lateral flexion Fingertips to joint line  Left lateral flexion Fingertips to joint line  Right rotation 50%  Left rotation 50%   (Blank rows = not tested)  LOWER EXTREMITY ROM:     WFL  LOWER EXTREMITY MMT:    Deferred due to pain level  LUMBAR SPECIAL TESTS:  Straight leg raise test: Positive  FUNCTIONAL TESTS:  5 times sit to stand: complete next visit Timed up and go (TUG): complete next visigt  GAIT: Distance walked: 30 feet Assistive device utilized: None Level of assistance: Mod A Comments: spouse helped her up out of chair and assisted her to walk to treatment room  TREATMENT DATE:  01/17/2024 NuStep Level 4 Les only 5 mins- PT present to discuss status Supine hamstring stretch with Stretch out strap 2 x 20 sec bilateral  Supine LTR x 10 bilateral 5 each hold TA activation 2 x 10 Hooklying TA activation + ball squeeze x 20 H[place]yling TA activation + hip abduction with yellow loop x 20  Cat cow x 10 Quadruped slow rock x 5 each side, added yoga block under left knee Quadruped with yoga block hip shifting 10x  left side Seated blue ball roll outs with side to side bias 10x Sitting on blue stability ball : pelvic tilts & circles x 20 each  01/09/2024 NuStep Level 3 3 mins- PT present to discuss  status Supine hamstring stretch 2 x 20 sec bilateral  Supine LTR x 10 bilateral 5 each hold TA activation 2  x 10 Hooklying TA activation + ball squeeze x 20 H[place]yling TA activation + hip abduction with yellow loop x 20  Cat cow x 10 Quadruped slow rock x 5 each side Peanut ball in between legs pelvic rocks x 10 bilateral  Sitting on blue stability ball : pelvic tilts & circles x 20 each   01/08/24 Initial eval completed Initiated HEP but not charged due to insurance restrictions                                                                                                                                 PATIENT EDUCATION:  Education details: Initiated HEP but not charged due to insurance restrictions Person educated: Patient and Spouse Education method: Programmer, Multimedia, Facilities Manager, Verbal cues, and Handouts Education comprehension: verbalized understanding and returned demonstration  HOME EXERCISE PROGRAM: Access Code: [facility] URL: [email or web address] Date: 01/09/2024 Prepared by: Kristeen Sar  Exercises - Supine Hamstring Stretch with Strap  - 1 x daily - 7 x weekly - 3 sets - 10 reps - Hooklying Transversus Abdominis Palpation  - 1 x daily - 7 x weekly - 3 sets - 10 reps - Supine Lower Trunk Rotation  - 1 x daily - 7 x weekly - 2 sets - 10 reps - Cat Cow  - 1 x daily - 7 x weekly - 3 sets - 10 reps - Quadruped Rocking Slow  - 1 x daily - 7 x weekly - 3 sets - 10 reps - Pelvic Tilt on Swiss Ball  - 1 x daily - 7 x weekly - 2 sets - 10 reps - Seated Lateral Pelvic Tilt on Swiss Ball  - 1 x daily - 7 x weekly - 2 sets - 10 reps  ASSESSMENT:  CLINICAL IMPRESSION: Much improved pain intensity the last few days.  She continues to respond well to pelvic mobility ex's particularly quadruped.  Therapist providing verbal cues to optimize technique with  exercises in order to achieve the greatest benefit and monitoring response.     OBJECTIVE IMPAIRMENTS: Abnormal  gait, decreased mobility, difficulty walking, decreased ROM, decreased strength, increased fascial restrictions, increased muscle spasms, impaired flexibility, improper body mechanics, postural dysfunction, and pain.   ACTIVITY LIMITATIONS: carrying, lifting, bending, standing, squatting, sleeping, stairs, transfers, bed mobility, bathing, toileting, dressing, hygiene/grooming, and caring for others  PARTICIPATION LIMITATIONS: meal prep, cleaning, laundry, driving, shopping, community activity, occupation, and yard work  PERSONAL FACTORS: Fitness, Past/current experiences, and 1 comorbidity: pregnancy are also affecting patient's functional outcome.   REHAB POTENTIAL: Good  CLINICAL DECISION MAKING: Stable/uncomplicated  EVALUATION COMPLEXITY: Low   GOALS: Goals reviewed with patient? Yes  SHORT TERM GOALS: Target date: 02/05/2024  Pain report to be no greater than 4/10  Baseline: Goal status: INITIAL  2.  Patient will be independent with initial HEP  Baseline:  Goal status: INITIAL   LONG TERM GOALS: Target date: 03/04/2024  Patient to report pain no greater than 2/10  Baseline:  Goal status: INITIAL  2.  Patient to be independent with advanced HEP  Baseline:  Goal status: INITIAL  3.  Patient to be able to ambulate safely and independently without an a.d. Baseline:  Goal status: INITIAL  4.  Patient to be able to care for her children independently Baseline:  Goal status: INITIAL  5.  Patient to be able to sleep through the night  Baseline:  Goal status: INITIAL  6.  Patient to report 85% improvement in overall symptoms  Baseline:  Goal status: INITIAL  PLAN:  PT FREQUENCY: 1-2x/week  PT DURATION: 8 weeks  PLANNED INTERVENTIONS: 97110-Therapeutic exercises, 97530- Therapeutic activity, 97112- Neuromuscular re-education, 97535- Self Care, 02859- Manual therapy, [phone]- Gait training, [phone]- Aquatic Therapy, [phone] (1-2 muscles), [phone] (3+ muscles)- Dry  Needling, Patient/Family education, Balance training, Stair training, Taping, Joint mobilization, Spinal mobilization, DME instructions, Cryotherapy, and Moist heat.  PLAN FOR NEXT SESSION: assess pain levels; continue core strengthening and lumbar mobility   Glade Pesa, PT 01/17/2024 5:05 PM Phone: [phone] Fax: [phone]  [facility] [address] [place], [place] [address] Phone # [phone] Fax [phone]

## 2024-01-22 DIAGNOSIS — R110 Nausea: Secondary | ICD-10-CM | POA: Diagnosis not present

## 2024-01-22 DIAGNOSIS — O09522 Supervision of elderly multigravida, second trimester: Secondary | ICD-10-CM | POA: Diagnosis not present

## 2024-01-22 DIAGNOSIS — Z315 Encounter for genetic counseling: Secondary | ICD-10-CM | POA: Diagnosis not present

## 2024-01-22 DIAGNOSIS — Z3A15 15 weeks gestation of pregnancy: Secondary | ICD-10-CM | POA: Diagnosis not present

## 2024-01-22 DIAGNOSIS — O3122X0 Continuing pregnancy after intrauterine death of one fetus or more, second trimester, not applicable or unspecified: Secondary | ICD-10-CM | POA: Diagnosis not present

## 2024-01-23 ENCOUNTER — Ambulatory Visit: Admitting: Physical Therapy

## 2024-01-25 ENCOUNTER — Ambulatory Visit: Admitting: Physical Therapy

## 2024-01-28 ENCOUNTER — Encounter: Payer: Self-pay | Admitting: Physical Therapy

## 2024-01-28 ENCOUNTER — Ambulatory Visit: Admitting: Physical Therapy

## 2024-01-28 DIAGNOSIS — M6281 Muscle weakness (generalized): Secondary | ICD-10-CM | POA: Diagnosis not present

## 2024-01-28 DIAGNOSIS — R293 Abnormal posture: Secondary | ICD-10-CM

## 2024-01-28 DIAGNOSIS — R252 Cramp and spasm: Secondary | ICD-10-CM

## 2024-01-28 NOTE — Therapy (Signed)
 " OUTPATIENT PHYSICAL THERAPY THORACOLUMBAR TREATMENT   Patient Name: Regina Scott MRN: [id number] DOB:1980/05/18, 43 y.o., female Today's Date: 01/28/2024  END OF SESSION:  PT End of Session - 01/28/24 1540     Visit Number 4    Date for Recertification  03/04/24    Authorization Type [employer] Approved 5 vl 01/08/2024-03/07/24    Authorization - Visit Number 3    Authorization - Number of Visits 5    PT Start Time 1448    PT Stop Time 1534    PT Time Calculation (min) 46 min    Activity Tolerance Patient tolerated treatment well    Behavior During Therapy [facility] for tasks assessed/performed            Past Medical History:  Diagnosis Date   Constipation    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   w/  suction for missed ab   LAPAROSCOPIC SALPINGO OOPHERECTOMY Left 02/13/2017   Procedure: operative laparoscopy, left salpingo oophorectomy, lysis of adhesions, fulgeration of endometriosis;  Surgeon: Danielle Rom, MD;  Location: 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   Ovarian cyst, complex 02/09/2017   Pelvic pain 02/09/2017   Postpartum care following vaginal delivery (5/16) 06/21/2013   SVD (spontaneous vaginal delivery) 06/21/2013    PCP: No  PCP  REFERRING PROVIDER: Delana Ted Morrison, DO  REFERRING DIAG: [phone] (ICD-10-CM) - Other specified diseases and conditions complicating pregnancy M54.9 (ICD-10-CM) - Dorsalgia, unspecified  Rationale for Evaluation and Treatment: Rehabilitation  THERAPY DIAG:  Muscle weakness (generalized)  Cramp and spasm  Abnormal posture  ONSET DATE: 01/02/2024  SUBJECTIVE:                                                                                                                                                                                           SUBJECTIVE STATEMENT: Patient reports it has been a rough week.  From Eval: Patient is [redacted] weeks gestation with twins.  This will be her 5th and 6th.  43 y.o. female who was seen today for physical therapy evaluation and treatment for low back pain with pregnancy.   She explains that she had low back pain with her other pregnancies.  She describes the pain as severe and mostly on the left side and hip but also having some pain on the right.  She is currently not working.  She has 4 other children.  PERTINENT HISTORY:  Long hx of low back pain She got an eipdural with all pregnancies   PAIN:  Are you having pain? Yes: NPRS scale: 1/10 Pain location: Left low back and bilateral hip Pain description: sharp, severe Aggravating factors: walking Relieving factors: rest ice heat  PRECAUTIONS: Other: pregnancy  RED FLAGS: None   WEIGHT BEARING RESTRICTIONS: No  FALLS:  Has patient fallen in last 6 months? No  LIVING ENVIRONMENT: Lives with: lives with their family Lives in: House/apartment   OCCUPATION: na  PLOF: Independent, Independent with basic ADLs, Independent with household mobility without device, Independent with community mobility without device, Independent with homemaking with ambulation, Independent with gait, and Independent with transfers  PATIENT GOALS: to be able to move around without severe pain  NEXT MD  VISIT: prn  OBJECTIVE:  Note: Objective measures were completed at Evaluation unless otherwise noted.  DIAGNOSTIC FINDINGS:  na  PATIENT SURVEYS:  Complete next visit  COGNITION: Overall cognitive status: Within functional limits for tasks assessed     SENSATION: Questionable radicular symptoms left LE : were not present today  MUSCLE LENGTH: Hamstrings: Right 50 deg; Left 45 deg Thomas test: Right pos ; Left pos   POSTURE: rounded shoulders, increased lumbar lordosis, and flexed trunk   PALPATION: na  LUMBAR ROM:   AROM eval  Flexion Fingertips to top of patella  Extension 25%  Right lateral flexion Fingertips to joint line  Left lateral flexion Fingertips to joint line  Right rotation 50%  Left rotation 50%   (Blank rows = not tested)  LOWER EXTREMITY ROM:     WFL  LOWER EXTREMITY MMT:    Deferred due to pain level  LUMBAR SPECIAL TESTS:  Straight leg raise test: Positive  FUNCTIONAL TESTS:  5 times sit to stand: complete next visit Timed up and go (TUG): complete next visigt  GAIT: Distance walked: 30 feet Assistive device utilized: None Level of assistance: Mod A Comments: spouse helped her up out of chair and assisted her to walk to treatment room  TREATMENT DATE:  01/28/2024 NuStep Level 4 Les only 5 mins- PT present to discuss status Seated blue ball roll outs with side to side bias 10x Sitting on blue stability ball : pelvic tilts & circles x 20 each Cat Cow x 10 Child's pose 3 x 20 sec  Sidelying hip rocks with peanut ball between legs x 10 bilateral  Hooklying TA activation + ball squeeze x 20 Hooklying alt hand and knee press with purple ball x 10 bilateral Discussion of back pain and possible SIJ involvement and how we can address this with therapy. Seated hip abduction with black loop x 20 Seated hip IR with purple ball in between knees  x 10 bilateral     01/17/2024 NuStep Level 4 Les only 5 mins- PT present to discuss  status Supine hamstring stretch with Stretch out strap 2 x 20 sec bilateral  Supine LTR x 10 bilateral 5 each hold TA activation  2 x 10 Hooklying TA activation + ball squeeze x 20 H[place]yling TA activation + hip abduction with yellow loop x 20  Cat cow x 10 Quadruped slow rock x 5 each side, added yoga block under left knee Quadruped with yoga block hip shifting 10x  left side Seated blue ball roll outs with side to side bias 10x Sitting on blue stability ball : pelvic tilts & circles x 20 each  01/09/2024 NuStep Level 3 3 mins- PT present to discuss status Supine hamstring stretch 2 x 20 sec bilateral  Supine LTR x 10 bilateral 5 each hold TA activation 2 x 10 Hooklying TA activation + ball squeeze x 20 H[place]yling TA activation + hip abduction with yellow loop x 20  Cat cow x 10 Quadruped slow rock x 5 each side Peanut ball in between legs pelvic rocks x 10 bilateral  Sitting on blue stability ball : pelvic tilts & circles x 20 each   01/08/24 Initial eval completed Initiated HEP but not charged due to insurance restrictions                                                                                                                                 PATIENT EDUCATION:  Education details: Initiated HEP but not charged due to insurance restrictions Person educated: Patient and Spouse Education method: Programmer, Multimedia, Facilities Manager, Verbal cues, and Handouts Education comprehension: verbalized understanding and returned demonstration  HOME EXERCISE PROGRAM: Access Code: [facility] URL: [email or web address] Date: 01/09/2024 Prepared by: Kristeen Sar  Exercises - Supine Hamstring Stretch with Strap  - 1 x daily - 7 x weekly - 3 sets - 10 reps - Hooklying Transversus Abdominis Palpation  - 1 x daily - 7 x weekly - 3 sets - 10 reps - Supine Lower Trunk Rotation  - 1 x daily - 7 x weekly - 2 sets - 10 reps - Cat Cow  - 1 x daily - 7 x weekly - 3 sets - 10 reps - Quadruped  Rocking Slow  - 1 x daily - 7 x weekly - 3 sets - 10 reps - Pelvic Tilt on Swiss Ball  - 1 x daily - 7 x weekly - 2 sets - 10 reps - Seated Lateral Pelvic Tilt on Swiss Ball  - 1 x daily - 7 x weekly - 2 sets - 10 reps  ASSESSMENT:  CLINICAL IMPRESSION: Dawnita has had a stressful week. Educated patient in mindful activities and ways to incorporate diaphragmatic breathing throughout her day. With clamshells exercises exercise she had sharp pain in her SIJ. This is usually where she has her pain. Educated patient on exercises we can perform in therapy to target strengthening of this. Overall, her low back pain has improved a lot since starting skilled therapy. PT monitored patient response throughout session and provided cues as needed.     OBJECTIVE IMPAIRMENTS: Abnormal gait, decreased mobility, difficulty walking, decreased ROM, decreased strength, increased fascial  restrictions, increased muscle spasms, impaired flexibility, improper body mechanics, postural dysfunction, and pain.   ACTIVITY LIMITATIONS: carrying, lifting, bending, standing, squatting, sleeping, stairs, transfers, bed mobility, bathing, toileting, dressing, hygiene/grooming, and caring for others  PARTICIPATION LIMITATIONS: meal prep, cleaning, laundry, driving, shopping, community activity, occupation, and yard work  PERSONAL FACTORS: Fitness, Past/current experiences, and 1 comorbidity: pregnancy are also affecting patient's functional outcome.   REHAB POTENTIAL: Good  CLINICAL DECISION MAKING: Stable/uncomplicated  EVALUATION COMPLEXITY: Low   GOALS: Goals reviewed with patient? Yes  SHORT TERM GOALS: Target date: 02/05/2024  Pain report to be no greater than 4/10  Baseline: Goal status: INITIAL  2.  Patient will be independent with initial HEP  Baseline:  Goal status: INITIAL   LONG TERM GOALS: Target date: 03/04/2024  Patient to report pain no greater than 2/10  Baseline:  Goal status: INITIAL  2.   Patient to be independent with advanced HEP  Baseline:  Goal status: INITIAL  3.  Patient to be able to ambulate safely and independently without an a.d. Baseline:  Goal status: INITIAL  4.  Patient to be able to care for her children independently Baseline:  Goal status: INITIAL  5.  Patient to be able to sleep through the night  Baseline:  Goal status: INITIAL  6.  Patient to report 85% improvement in overall symptoms  Baseline:  Goal status: INITIAL  PLAN:  PT FREQUENCY: 1-2x/week  PT DURATION: 8 weeks  PLANNED INTERVENTIONS: 97110-Therapeutic exercises, 97530- Therapeutic activity, 97112- Neuromuscular re-education, 97535- Self Care, 02859- Manual therapy, [phone]- Gait training, [phone]- Aquatic Therapy, [phone] (1-2 muscles), 20561 (3+ muscles)- Dry Needling, Patient/Family education, Balance training, Stair training, Taping, Joint mobilization, Spinal mobilization, DME instructions, Cryotherapy, and Moist heat.  PLAN FOR NEXT SESSION: request more auth; hip strengthening; SIJ pain continue core strengthening and lumbar mobility    Kristeen Sar, PT, DPT 01/28/2024 3:42 PM [facility] [address] [place], [place] [address] Phone # [phone] Fax [phone]   "

## 2024-02-06 ENCOUNTER — Ambulatory Visit: Admitting: Physical Therapy

## 2024-02-08 ENCOUNTER — Ambulatory Visit: Attending: Obstetrics and Gynecology | Admitting: Physical Therapy

## 2024-02-08 ENCOUNTER — Encounter: Payer: Self-pay | Admitting: Physical Therapy

## 2024-02-08 DIAGNOSIS — R252 Cramp and spasm: Secondary | ICD-10-CM | POA: Insufficient documentation

## 2024-02-08 DIAGNOSIS — R293 Abnormal posture: Secondary | ICD-10-CM | POA: Insufficient documentation

## 2024-02-08 DIAGNOSIS — M6281 Muscle weakness (generalized): Secondary | ICD-10-CM | POA: Insufficient documentation

## 2024-02-08 NOTE — Therapy (Signed)
 " OUTPATIENT PHYSICAL THERAPY THORACOLUMBAR TREATMENT   Patient Name: Regina Scott MRN: [id number] DOB:1980/12/15, 44 y.o., female Today's Date: 02/08/2024  END OF SESSION:  PT End of Session - 02/08/24 0938     Visit Number 5    Date for Recertification  03/04/24    Authorization Type [employer] Approved 5 vl 01/08/2024-03/07/24 (more requested)    Authorization - Visit Number 4    Authorization - Number of Visits 5    PT Start Time 0850    PT Stop Time 0929    PT Time Calculation (min) 39 min    Activity Tolerance Patient tolerated treatment well    Behavior During Therapy [facility] for tasks assessed/performed             Past Medical History:  Diagnosis Date   Constipation    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   Ovarian cyst, complex 02/09/2017   Pelvic pain 02/09/2017   Postpartum care following vaginal delivery (5/16) 06/21/2013   SVD (spontaneous vaginal delivery)  06/21/2013    PCP: No PCP  REFERRING PROVIDER: Delana Ted Morrison, DO  REFERRING DIAG: [phone] (ICD-10-CM) - Other specified diseases and conditions complicating pregnancy M54.9 (ICD-10-CM) - Dorsalgia, unspecified  Rationale for Evaluation and Treatment: Rehabilitation  THERAPY DIAG:  Muscle weakness (generalized)  Cramp and spasm  Abnormal posture  ONSET DATE: 01/02/2024  SUBJECTIVE:                                                                                                                                                                                           SUBJECTIVE STATEMENT: Patient presents with 5/10 back  pain. She thinks she over did it yesterday with cleaning  From Eval: Patient is [redacted] weeks gestation with twins.  This will be her 5th and 6th.  44 y.o. female who was seen today for physical therapy evaluation and treatment for low back pain with pregnancy.   She explains that she had low back pain with her other pregnancies.  She describes the pain as severe and mostly on the left side and hip but also having some pain on the right.  She is currently not working.  She has 4 other children.  PERTINENT HISTORY:  Long hx of low back pain She got an eipdural with all pregnancies   PAIN:  Are you having pain? Yes: NPRS scale: 1/10 Pain location: Left low back and bilateral hip Pain description: sharp, severe Aggravating factors: walking Relieving factors: rest ice heat  PRECAUTIONS: Other: pregnancy  RED FLAGS: None   WEIGHT BEARING RESTRICTIONS: No  FALLS:  Has patient fallen in last 6 months? No  LIVING ENVIRONMENT: Lives with: lives with their family Lives in: House/apartment   OCCUPATION: na  PLOF: Independent, Independent with basic ADLs, Independent with household mobility without device, Independent with community mobility without device, Independent with homemaking with ambulation, Independent with gait, and Independent with  transfers  PATIENT GOALS: to be able to move around without severe pain  NEXT MD VISIT: prn  OBJECTIVE:  Note: Objective measures were completed at Evaluation unless otherwise noted.  DIAGNOSTIC FINDINGS:  na  PATIENT SURVEYS:  1/22026 ODI:9/50 18%  COGNITION: Overall cognitive status: Within functional limits for tasks assessed     SENSATION: Questionable radicular symptoms left LE : were not present today  MUSCLE LENGTH: Hamstrings: Right 50 deg; Left 45 deg Thomas test: Right pos ; Left pos   POSTURE: rounded shoulders, increased lumbar lordosis, and flexed trunk   PALPATION: na  LUMBAR ROM:   AROM eval  Flexion Fingertips to top of patella  Extension 25%  Right lateral flexion Fingertips to joint line  Left lateral flexion Fingertips to joint line  Right rotation 50%  Left rotation 50%   (Blank rows = not tested)  LOWER EXTREMITY ROM:     WFL  LOWER EXTREMITY MMT:    Deferred due to pain level  LUMBAR SPECIAL TESTS:  Straight leg raise test: Positive  FUNCTIONAL TESTS:  5 times sit to stand: complete next visit Timed up and go (TUG): complete next visigt  GAIT: Distance walked: 30 feet Assistive device utilized: None Level of assistance: Mod A Comments: spouse helped her up out of chair and assisted her to walk to treatment room  TREATMENT DATE:  02/08/2024 Review & update on status for more insurance visits Seated TA activation + Ball squeeze x 20 SL hip IR with purple ball in between legs 2 x 10 bilateral  SL clamshell with red loop 2 x 10 bilateral  Trial of Serola Belt Review and update of HEP     01/28/2024 NuStep Level 4 Les only 5 mins- PT present to discuss status Seated blue ball roll outs with side to side bias 10x Sitting on blue stability ball : pelvic tilts & circles x 20 each Cat Cow x 10 Child's pose 3 x 20 sec  Sidelying hip rocks with peanut ball between legs x 10 bilateral  Hooklying TA activation + ball squeeze x  20 Hooklying alt hand and knee press with purple ball x 10 bilateral Discussion of back pain and possible SIJ involvement and how we  can address this with therapy. Seated hip abduction with black loop x 20 Seated hip IR with purple ball in between knees  x 10 bilateral     01/17/2024 NuStep Level 4 Les only 5 mins- PT present to discuss status Supine hamstring stretch with Stretch out strap 2 x 20 sec bilateral  Supine LTR x 10 bilateral 5 each hold TA activation 2 x 10 Hooklying TA activation + ball squeeze x 20 H[place]yl[place] TA activation + hip abduction with yellow loop x 20  Cat cow x 10 Quadruped slow rock x 5 each side, added yoga block under left knee Quadruped with yoga block hip shifting 10x  left side Seated blue ball roll outs with side to side bias 10x Sitting on blue stability ball : pelvic tilts & circles x 20 each  01/09/2024 NuStep Level 3 3 mins- PT present to discuss status Supine hamstring stretch 2 x 20 sec bilateral  Supine LTR x 10 bilateral 5 each hold TA activation 2 x 10 Hooklying TA activation + ball squeeze x 20 H[place]yling TA activation + hip abduction with yellow loop x 20  Cat cow x 10 Quadruped slow rock x 5 each side Peanut ball in between legs pelvic rocks x 10 bilateral  Sitting on blue stability ball : pelvic tilts & circles x 20 each                                                                                                                             PATIENT EDUCATION:  Education details: Initiated HEP but not charged due to insurance restrictions Person educated: Patient and Spouse Education method: Programmer, Multimedia, Facilities Manager, Verbal cues, and Handouts Education comprehension: verbalized understanding and returned demonstration  HOME EXERCISE PROGRAM: Access Code: [facility] URL: [email or web address] Date: 02/08/2024 Prepared by: Kristeen Sar  Exercises - Supine Hamstring Stretch with Strap  - 1 x daily - 7 x weekly - 3  sets - 10 reps - Hooklying Transversus Abdominis Palpation  - 1 x daily - 7 x weekly - 3 sets - 10 reps - Supine Lower Trunk Rotation  - 1 x daily - 7 x weekly - 2 sets - 10 reps - Cat Cow  - 1 x daily - 7 x weekly - 3 sets - 10 reps - Quadruped Rocking Slow  - 1 x daily - 7 x weekly - 3 sets - 10 reps - Pelvic Tilt on Swiss Ball  - 1 x daily - 7 x weekly - 2 sets - 10 reps - Seated Lateral Pelvic Tilt on Swiss Ball  - 1 x daily - 7 x weekly - 2 sets - 10 reps - Clamshell with Resistance  - 1 x daily - 7 x weekly - 2 sets - 10 reps - Sidelying Reverse Clamshell  - 1 x daily - 7 x weekly - 2 sets - 10 reps - Seated Hip Adduction Isometrics with Ball  - 1 x daily - 7 x  weekly - 2 sets - 10 reps  ASSESSMENT:  CLINICAL IMPRESSION: Tanee verbalized feeling 75% better since starting skilled therapy. She fatigues with prolonged standing when washing dishes and cooking. With bending lifting activities she has increased low back/ SIJ pain. Today we did a trial of the serola belt for more support when doing functional activities. Patient responded well to this, provided information on where patient can get one. She was able to [facility] for 3 hours with minimal difficulty, but she had increased fatigue when she got home. Updated HEP to include hip strengthening exercises. Patient verbalized muscular fatigue at end of session. PT monitored patient response throughout and provided cues as needed.  Patient demonstrates good rehab potential to achieve stated goals through skilled therapy intervention.      OBJECTIVE IMPAIRMENTS: Abnormal gait, decreased mobility, difficulty walking, decreased ROM, decreased strength, increased fascial restrictions, increased muscle spasms, impaired flexibility, improper body mechanics, postural dysfunction, and pain.   ACTIVITY LIMITATIONS: carrying, lifting, bending, standing, squatting, sleeping, stairs, transfers, bed mobility, bathing, toileting,  dressing, hygiene/grooming, and caring for others  PARTICIPATION LIMITATIONS: meal prep, cleaning, laundry, driving, shopping, community activity, occupation, and yard work  PERSONAL FACTORS: Fitness, Past/current experiences, and 1 comorbidity: pregnancy are also affecting patient's functional outcome.   REHAB POTENTIAL: Good  CLINICAL DECISION MAKING: Stable/uncomplicated  EVALUATION COMPLEXITY: Low   GOALS: Goals reviewed with patient? Yes  SHORT TERM GOALS: Target date: 02/05/2024  Pain report to be no greater than 4/10  Baseline: Goal status: IN PROGRESS (5/10) 02/08/2024  2.  Patient will be independent with initial HEP  Baseline:  Goal status: MET 02/08/2024   LONG TERM GOALS: Target date: 03/04/2024  Patient to report pain no greater than 2/10  Baseline:  Goal status: IN PROGRESS (5/10) 02/08/2024  2.  Patient to be independent with advanced HEP  Baseline:  Goal status: IN PROGRESS 02/08/2024  3.  Patient to be able to ambulate safely and independently without an a.d. Baseline:  Goal status: MET   4.  Patient to be able to care for her children independently Baseline:  Goal status: IN PROGRESS (pain with bending) 02/08/2024  5.  Patient to be able to sleep through the night  Baseline:  Goal status: IN PROGRESS (she has to readjust a lot)02/08/2024  6.  Patient to report 85% improvement in overall symptoms  Baseline:  Goal status:IN PROGRESS (75%) 02/08/2024  PLAN:  PT FREQUENCY: 1-2x/week  PT DURATION: 8 weeks  PLANNED INTERVENTIONS: 97110-Therapeutic exercises, 97530- Therapeutic activity, 97112- Neuromuscular re-education, 97535- Self Care, 02859- Manual therapy, [phone]- Gait training, [phone]- Aquatic Therapy, [phone] (1-2 muscles), 20561 (3+ muscles)- Dry Needling, Patient/Family education, Balance training, Stair training, Taping, Joint mobilization, Spinal mobilization, DME instructions, Cryotherapy, and Moist heat.  PLAN FOR NEXT SESSION: hip strengthening; SIJ  pain continue core strengthening and lumbar mobility; more insurance auth requested (02/08/2024)    Kristeen Sar, PT, DPT 02/08/2024 9:39 AM [facility] [address] [place], [place] [address] Phone # [phone] Fax [phone]   "

## 2024-02-13 ENCOUNTER — Ambulatory Visit: Admitting: Physical Therapy

## 2024-02-15 ENCOUNTER — Ambulatory Visit: Admitting: Physical Therapy

## 2024-02-15 ENCOUNTER — Encounter: Payer: Self-pay | Admitting: Physical Therapy

## 2024-02-15 DIAGNOSIS — M6281 Muscle weakness (generalized): Secondary | ICD-10-CM

## 2024-02-15 DIAGNOSIS — R252 Cramp and spasm: Secondary | ICD-10-CM

## 2024-02-15 DIAGNOSIS — R293 Abnormal posture: Secondary | ICD-10-CM

## 2024-02-15 NOTE — Therapy (Signed)
 " OUTPATIENT PHYSICAL THERAPY THORACOLUMBAR TREATMENT   Patient Name: Regina Scott MRN: [id number] DOB:12/04/1980, 44 y.o., female Today's Date: 02/15/2024  END OF SESSION:  PT End of Session - 02/15/24 0934     Visit Number 6    Date for Recertification  03/04/24    Authorization Type [employer] Approved 5 vl 01/08/2024-03/07/24 (more requested)    Authorization - Visit Number 5    Authorization - Number of Visits 5    PT Start Time [phone]    PT Stop Time 1013    PT Time Calculation (min) 42 min    Activity Tolerance Patient tolerated treatment well             Past Medical History:  Diagnosis Date   Constipation    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   Ovarian cyst, complex 02/09/2017   Pelvic pain 02/09/2017   Postpartum care following vaginal delivery (5/16) 06/21/2013   SVD (spontaneous vaginal delivery) 06/21/2013    PCP: No PCP  REFERRING PROVIDER: Delana Ted Morrison, DO  REFERRING DIAG: [phone] (ICD-10-CM) - Other specified diseases and conditions complicating pregnancy M54.9 (ICD-10-CM) - Dorsalgia, unspecified  Rationale for Evaluation and Treatment: Rehabilitation  THERAPY DIAG:  Muscle weakness (generalized)  Cramp and spasm  Abnormal posture  ONSET DATE: 01/02/2024  SUBJECTIVE:                                                                                                                                                                                           SUBJECTIVE STATEMENT: Feeling good this morning.  I like the strengthening ex's.    Currently pregnant:  She has 4 other children.  PERTINENT HISTORY:  Long hx of low back pain She got an eipdural with all pregnancies   PAIN:  Are you having pain? Yes: NPRS scale: 0/10 Pain location: Left low back and bilateral hip Pain description: sharp, severe Aggravating factors: walking Relieving factors: rest ice heat  PRECAUTIONS: Other: pregnancy  RED FLAGS: None   WEIGHT BEARING RESTRICTIONS: No  FALLS:  Has patient fallen in last 6 months? No  LIVING ENVIRONMENT: Lives with: lives with their family Lives in: House/apartment   OCCUPATION: na  PLOF: Independent, Independent with basic ADLs, Independent with household mobility without device, Independent with community mobility without device, Independent with homemaking with ambulation, Independent with gait, and Independent with transfers  PATIENT GOALS: to be able to move around without severe pain  NEXT MD VISIT: prn  OBJECTIVE:  Note: Objective measures were completed at Evaluation unless otherwise noted.  DIAGNOSTIC FINDINGS:  na  PATIENT SURVEYS:  1/22026 ODI:9/50 18%  COGNITION: Overall cognitive status: Within functional limits for tasks assessed     SENSATION: Questionable radicular symptoms left LE : were not present today  MUSCLE LENGTH: Hamstrings: Right 50 deg; Left 45 deg Thomas  test: Right pos ; Left pos   POSTURE: rounded shoulders, increased lumbar lordosis, and flexed trunk   PALPATION: na  LUMBAR ROM:   AROM eval  Flexion Fingertips to top of patella  Extension 25%  Right lateral flexion Fingertips to joint line  Left lateral flexion Fingertips to joint line  Right rotation 50%  Left rotation 50%   (Blank rows = not tested)  LOWER EXTREMITY ROM:     WFL  LOWER EXTREMITY MMT:    Deferred due to pain level  LUMBAR SPECIAL TESTS:  Straight leg raise test: Positive   GAIT: Distance walked: 30 feet Assistive device utilized: None Level of assistance: Mod A Comments: spouse helped her up out of chair and assisted her to walk to treatment room  TREATMENT DATE:  02/15/2024: Sidelying hip protrusion on foam roll with open books  combination10x right/left Quadruped with yoga block under knee tail wags and cat cow 10x each side  Ball on wall hip abduction with mini squats 10x right/left (some knee pain although less with verbal cue for more hip hinge) 1/2 kneel on BOSU:  green band Pallof series: press out, press up, press out and up right/left Sitting on blue ball: pelvic mobility between sets: red band UE horizontal abduction, diagonals, external rotation 10x each Seated: blue ball roll outs 3 ways  02/08/2024 Review & update on status for more insurance visits Seated TA activation + Ball squeeze x 20 SL hip IR with purple ball in between legs 2 x 10 bilateral  SL clamshell with red loop 2 x 10 bilateral  Trial of Serola Belt Review and update of HEP     01/28/2024 NuStep Level 4 Les only 5 mins- PT present to discuss status Seated blue ball roll outs with side to side bias 10x Sitting on blue stability ball : pelvic tilts & circles x 20 each Cat Cow x 10 Child's pose 3 x 20 sec  Sidelying hip rocks with peanut ball between legs x 10 bilateral  Hooklying TA activation + ball squeeze x 20 Hooklying alt hand and knee press with purple  ball x 10 bilateral Discussion of back pain and possible SIJ involvement and how we can address this with therapy. Seated hip abduction with black loop x 20 Seated hip IR with  purple ball in between knees  x 10 bilateral     01/17/2024 NuStep Level 4 Les only 5 mins- PT present to discuss status Supine hamstring stretch with Stretch out strap 2 x 20 sec bilateral  Supine LTR x 10 bilateral 5 each hold TA activation 2 x 10 Hooklying TA activation + ball squeeze x 20 [place]yling TA activation + hip abduction with yellow loop x 20  Cat cow x 10 Quadruped slow rock x 5 each side, added yoga block under left knee Quadruped with yoga block hip shifting 10x  left side Seated blue ball roll outs with side to side bias 10x Sitting on blue stability ball : pelvic tilts & circles x 20 each                                                                                                                            PATIENT EDUCATION:  Education details: Initiated HEP but not charged due to insurance restrictions Person educated: Patient and Spouse Education method: Programmer, Multimedia, Facilities Manager, Verbal cues, and Handouts Education comprehension: verbalized understanding and returned demonstration  HOME EXERCISE PROGRAM: Access Code: [facility] URL: [email or web address] Date: 02/08/2024 Prepared by: Kristeen Sar  Exercises - Supine Hamstring Stretch with Strap  - 1 x daily - 7 x weekly - 3 sets - 10 reps - Hooklying Transversus Abdominis Palpation  - 1 x daily - 7 x weekly - 3 sets - 10 reps - Supine Lower Trunk Rotation  - 1 x daily - 7 x weekly - 2 sets - 10 reps - Cat Cow  - 1 x daily - 7 x weekly - 3 sets - 10 reps - Quadruped Rocking Slow  - 1 x daily - 7 x weekly - 3 sets - 10 reps - Pelvic Tilt on Swiss Ball  - 1 x daily - 7 x weekly - 2 sets - 10 reps - Seated Lateral Pelvic Tilt on Swiss Ball  - 1 x daily - 7 x weekly - 2 sets - 10 reps - Clamshell with Resistance  - 1 x daily  - 7 x weekly - 2 sets - 10 reps - Sidelying Reverse Clamshell  - 1 x daily - 7 x weekly - 2 sets - 10 reps - Seated Hip Adduction Isometrics with Ball  - 1 x daily - 7 x weekly - 2 sets - 10 reps  ASSESSMENT:  CLINICAL IMPRESSION: Treatment focus on pelvic mobility as well as lumbo/pelvic/hip core ex's appropriate for pregnancy.  Therapist providing verbal cues to optimize technique with exercises and monitoring response.  She has some knee pain with mini squats although decreased with more of a hip hinge.  No back pain reported today.     OBJECTIVE IMPAIRMENTS: Abnormal gait, decreased mobility, difficulty walking, decreased ROM, decreased strength, increased fascial restrictions, increased muscle spasms, impaired flexibility, improper body mechanics, postural dysfunction, and pain.   ACTIVITY LIMITATIONS: carrying, lifting, bending, standing, squatting, sleeping, stairs, transfers, bed mobility,  bathing, toileting, dressing, hygiene/grooming, and caring for others  PARTICIPATION LIMITATIONS: meal prep, cleaning, laundry, driving, shopping, community activity, occupation, and yard work  PERSONAL FACTORS: Fitness, Past/current experiences, and 1 comorbidity: pregnancy are also affecting patient's functional outcome.   REHAB POTENTIAL: Good  CLINICAL DECISION MAKING: Stable/uncomplicated  EVALUATION COMPLEXITY: Low   GOALS: Goals reviewed with patient? Yes  SHORT TERM GOALS: Target date: 02/05/2024  Pain report to be no greater than 4/10  Baseline: Goal status: IN PROGRESS (5/10) 02/08/2024  2.  Patient will be independent with initial HEP  Baseline:  Goal status: MET 02/08/2024   LONG TERM GOALS: Target date: 03/04/2024  Patient to report pain no greater than 2/10  Baseline:  Goal status: IN PROGRESS (5/10) 02/08/2024  2.  Patient to be independent with advanced HEP  Baseline:  Goal status: IN PROGRESS 02/08/2024  3.  Patient to be able to ambulate safely and independently  without an a.d. Baseline:  Goal status: MET   4.  Patient to be able to care for her children independently Baseline:  Goal status: IN PROGRESS (pain with bending) 02/08/2024  5.  Patient to be able to sleep through the night  Baseline:  Goal status: IN PROGRESS (she has to readjust a lot)02/08/2024  6.  Patient to report 85% improvement in overall symptoms  Baseline:  Goal status:IN PROGRESS (75%) 02/08/2024  PLAN:  PT FREQUENCY: 1-2x/week  PT DURATION: 8 weeks  PLANNED INTERVENTIONS: 97110-Therapeutic exercises, 97530- Therapeutic activity, 97112- Neuromuscular re-education, 97535- Self Care, 02859- Manual therapy, [phone]- Gait training, [phone]- Aquatic Therapy, [phone] (1-2 muscles), 20561 (3+ muscles)- Dry Needling, Patient/Family education, Balance training, Stair training, Taping, Joint mobilization, Spinal mobilization, DME instructions, Cryotherapy, and Moist heat.  PLAN FOR NEXT SESSION: hip strengthening; SIJ pain continue core strengthening and lumbar mobility; 4 additional visits approved   Glade Pesa, PT 02/15/2024 11:10 AM Phone: [phone] Fax: [phone]  [facility] [address], [address] [place], [place] [address] Phone # [phone] Fax [phone]   "

## 2024-02-20 ENCOUNTER — Ambulatory Visit: Admitting: Physical Therapy

## 2024-02-22 ENCOUNTER — Ambulatory Visit: Admitting: Physical Therapy

## 2024-02-22 ENCOUNTER — Encounter: Payer: Self-pay | Admitting: Physical Therapy

## 2024-02-22 DIAGNOSIS — R293 Abnormal posture: Secondary | ICD-10-CM

## 2024-02-22 DIAGNOSIS — M6281 Muscle weakness (generalized): Secondary | ICD-10-CM | POA: Diagnosis not present

## 2024-02-22 DIAGNOSIS — R252 Cramp and spasm: Secondary | ICD-10-CM

## 2024-02-22 NOTE — Therapy (Signed)
 " OUTPATIENT PHYSICAL THERAPY THORACOLUMBAR TREATMENT   Patient Name: Regina Scott MRN: [id number] DOB:[birth year], 44 y.o., female Today's Date: 02/22/2024  END OF SESSION:  PT End of Session - 02/22/24 0843     Visit Number 7    Date for Recertification  03/04/24    Authorization Type [employer] Approved 4 vl 02/22/2024 - 03/22/2024,    Authorization - Visit Number 1    Authorization - Number of Visits 4    PT Start Time 0803    PT Stop Time 0841    PT Time Calculation (min) 38 min    Activity Tolerance Patient tolerated treatment well    Behavior During Therapy [facility] for tasks assessed/performed              Past Medical History:  Diagnosis Date   Constipation    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   Ovarian cyst, complex 02/09/2017   Pelvic pain 02/09/2017   Postpartum care following vaginal delivery (5/16) 06/21/2013   SVD (spontaneous vaginal delivery) 06/21/2013     PCP: No PCP  REFERRING PROVIDER: Delana Ted Morrison, DO  REFERRING DIAG: [phone] (ICD-10-CM) - Other specified diseases and conditions complicating pregnancy M54.9 (ICD-10-CM) - Dorsalgia, unspecified  Rationale for Evaluation and Treatment: Rehabilitation  THERAPY DIAG:  Muscle weakness (generalized)  Cramp and spasm  Abnormal posture  ONSET DATE: 01/02/2024  SUBJECTIVE:                                                                                                                                                                                           SUBJECTIVE STATEMENT: Patient reports she is  doing good today. She had some back discomfort last night but she thinks it is because she didn't have a pillow between her knees.   Currently pregnant:   She has 4 other children.  PERTINENT HISTORY:  Long hx of low back pain She got an eipdural with all pregnancies   PAIN:  Are you having pain? Yes: NPRS scale: 0/10 Pain location: Left low back and bilateral hip Pain description: sharp, severe Aggravating factors: walking Relieving factors: rest ice heat  PRECAUTIONS: Other: pregnancy  RED FLAGS: None   WEIGHT BEARING RESTRICTIONS: No  FALLS:  Has patient fallen in last 6 months? No  LIVING ENVIRONMENT: Lives with: lives with their family Lives in: House/apartment   OCCUPATION: na  PLOF: Independent, Independent with basic ADLs, Independent with household mobility without device, Independent with community mobility without device, Independent with homemaking with ambulation, Independent with gait, and Independent with transfers  PATIENT GOALS: to be able to move around without severe pain  NEXT MD VISIT: prn  OBJECTIVE:  Note: Objective measures were completed at Evaluation unless otherwise noted.  DIAGNOSTIC FINDINGS:  na  PATIENT SURVEYS:  1/22026 ODI:9/50 18%  COGNITION: Overall cognitive status: Within functional limits for tasks  assessed     SENSATION: Questionable radicular symptoms left LE : were not present today  MUSCLE LENGTH: Hamstrings: Right 50 deg; Left 45 deg Thomas test: Right pos ; Left pos   POSTURE: rounded shoulders, increased lumbar lordosis, and flexed trunk   PALPATION: na  LUMBAR ROM:   AROM eval  Flexion Fingertips to top of patella  Extension 25%  Right lateral flexion Fingertips to joint line  Left lateral flexion Fingertips to joint line  Right rotation 50%  Left rotation 50%   (Blank rows = not tested)  LOWER EXTREMITY ROM:     WFL  LOWER EXTREMITY MMT:    Deferred due to pain level  LUMBAR SPECIAL TESTS:  Straight leg raise test: Positive   GAIT: Distance walked: 30 feet Assistive device utilized: None Level of assistance: Mod A Comments: spouse helped her up out of chair and assisted her to walk to treatment room  TREATMENT DATE:  02/22/2024 (20 weeks today) NuStep Level 5 5 mins- PT present to discuss status Seated TA activation + Ball squeeze x 20 SL hip IR with purple ball in between legs 2 x 10 bilateral (harder on Lt side)  SL clamshell with red loop 2 x 10 bilateral  Open books x 10 each side Thread the needle with foam roll to deepen x 10 bilateral  Cat cow x 10 (x5 with feet neutral x 5 with hip IR) Pallof press with green TB x 12 each direction Side stepping with red loop above knee x 3 laps at counter Standing shoulder extension with green TB 2 x 10    02/15/2024: Sidelying hip protrusion on foam roll with open books  combination10x right/left Quadruped with yoga block under knee tail wags and cat cow 10x each side  Ball on wall hip abduction with mini squats 10x right/left (some knee pain although less with verbal cue for more hip hinge) 1/2 kneel on BOSU:  green band Pallof series: press out, press up, press out and up right/left Sitting on blue ball: pelvic mobility between sets: red band UE horizontal abduction, diagonals, external rotation  10x each Seated: blue ball roll outs 3 ways  02/08/2024 Review & update on status for more insurance visits Seated TA activation + Ball squeeze x 20 SL  hip IR with purple ball in between legs 2 x 10 bilateral  SL clamshell with red loop 2 x 10 bilateral  Trial of Serola Belt Review and update of HEP     01/28/2024 NuStep Level 4 Les only 5 mins- PT present to discuss status Seated blue ball roll outs with side to side bias 10x Sitting on blue stability ball : pelvic tilts & circles x 20 each Cat Cow x 10 Child's pose 3 x 20 sec  Sidelying hip rocks with peanut ball between legs x 10 bilateral  Hooklying TA activation + ball squeeze x 20 Hooklying alt hand and knee press with purple ball x 10 bilateral Discussion of back pain and possible SIJ involvement and how we can address this with therapy. Seated hip abduction with black loop x 20 Seated hip IR with purple ball in between knees  x 10 bilateral                                                                                                                              PATIENT EDUCATION:  Education details: Initiated HEP but not charged due to insurance restrictions Person educated: Patient and Spouse Education method: Programmer, Multimedia, Facilities Manager, Verbal cues, and Handouts Education comprehension: verbalized understanding and returned demonstration  HOME EXERCISE PROGRAM: Access Code: [facility] URL: [email or web address] Date: 02/08/2024 Prepared by: Kristeen Sar  Exercises - Supine Hamstring Stretch with Strap  - 1 x daily - 7 x weekly - 3 sets - 10 reps - Hooklying Transversus Abdominis Palpation  - 1 x daily - 7 x weekly - 3 sets - 10 reps - Supine Lower Trunk Rotation  - 1 x daily - 7 x weekly - 2 sets - 10 reps - Cat Cow  - 1 x daily - 7 x weekly - 3 sets - 10 reps - Quadruped Rocking Slow  - 1 x daily - 7 x weekly - 3 sets - 10 reps - Pelvic Tilt on Swiss Ball  - 1 x daily - 7 x weekly - 2 sets - 10  reps - Seated Lateral Pelvic Tilt on Swiss Ball  - 1 x daily - 7 x weekly - 2 sets - 10 reps - Clamshell with Resistance  - 1 x daily - 7 x weekly - 2 sets - 10 reps - Sidelying Reverse Clamshell  - 1 x daily - 7 x weekly - 2 sets - 10 reps - Seated Hip Adduction Isometrics with Ball  - 1 x daily - 7 x weekly - 2 sets - 10 reps  ASSESSMENT:  CLINICAL IMPRESSION: Treatment session focused on hip strengthening and lumbar mobility. Overall, her pain is improving. She had some discomfort last night but pain was resolved with adding a pillow between her knees. PT monitored patient throughout session and provided cues as needed. Patient will benefit from skilled PT to address the below impairments and improve overall function. Patient will benefit from skilled PT to  address the below impairments and improve overall function.      OBJECTIVE IMPAIRMENTS: Abnormal gait, decreased mobility, difficulty walking, decreased ROM, decreased strength, increased fascial restrictions, increased muscle spasms, impaired flexibility, improper body mechanics, postural dysfunction, and pain.   ACTIVITY LIMITATIONS: carrying, lifting, bending, standing, squatting, sleeping, stairs, transfers, bed mobility, bathing, toileting, dressing, hygiene/grooming, and caring for others  PARTICIPATION LIMITATIONS: meal prep, cleaning, laundry, driving, shopping, community activity, occupation, and yard work  PERSONAL FACTORS: Fitness, Past/current experiences, and 1 comorbidity: pregnancy are also affecting patient's functional outcome.   REHAB POTENTIAL: Good  CLINICAL DECISION MAKING: Stable/uncomplicated  EVALUATION COMPLEXITY: Low   GOALS: Goals reviewed with patient? Yes  SHORT TERM GOALS: Target date: 02/05/2024  Pain report to be no greater than 4/10  Baseline: Goal status: IN PROGRESS (5/10) 02/08/2024  2.  Patient will be independent with initial HEP  Baseline:  Goal status: MET 02/08/2024   LONG TERM  GOALS: Target date: 03/04/2024  Patient to report pain no greater than 2/10  Baseline:  Goal status: IN PROGRESS (5/10) 02/08/2024  2.  Patient to be independent with advanced HEP  Baseline:  Goal status: IN PROGRESS 02/08/2024  3.  Patient to be able to ambulate safely and independently without an a.d. Baseline:  Goal status: MET   4.  Patient to be able to care for her children independently Baseline:  Goal status: IN PROGRESS (pain with bending) 02/08/2024  5.  Patient to be able to sleep through the night  Baseline:  Goal status: IN PROGRESS (she has to readjust a lot)02/08/2024  6.  Patient to report 85% improvement in overall symptoms  Baseline:  Goal status:IN PROGRESS (75%) 02/08/2024  PLAN:  PT FREQUENCY: 1-2x/week  PT DURATION: 8 weeks  PLANNED INTERVENTIONS: 97110-Therapeutic exercises, 97530- Therapeutic activity, 97112- Neuromuscular re-education, 97535- Self Care, [id number]- Manual therapy, [phone]- Gait training, [phone]- Aquatic Therapy, [phone] (1-2 muscles), [phone] (3+ muscles)- Dry Needling, Patient/Family education, Balance training, Stair training, Taping, Joint mobilization, Spinal mobilization, DME instructions, Cryotherapy, and Moist heat.  PLAN FOR NEXT SESSION: recert until 2/14; hip strengthening; SIJ pain continue core strengthening and lumbar mobility    Kristeen Sar, PT, DPT 02/22/24 8:44 AM [facility] [address], [address] [place], [place] [address] Phone # [phone] Fax [phone]   "

## 2024-02-27 ENCOUNTER — Ambulatory Visit: Admitting: Physical Therapy

## 2024-02-28 ENCOUNTER — Other Ambulatory Visit

## 2024-02-28 ENCOUNTER — Ambulatory Visit

## 2024-02-29 ENCOUNTER — Ambulatory Visit: Admitting: Physical Therapy

## 2024-03-04 ENCOUNTER — Ambulatory Visit: Admitting: Physical Therapy

## 2024-03-07 ENCOUNTER — Ambulatory Visit: Admitting: Physical Therapy

## 2024-03-14 ENCOUNTER — Ambulatory Visit: Admitting: Physical Therapy

## 2024-03-21 ENCOUNTER — Ambulatory Visit: Admitting: Physical Therapy
